# Patient Record
Sex: Male | Born: 1960 | Race: White | Hispanic: No
Health system: Southern US, Community
[De-identification: ages and names within clinical notes are randomized; demographics above are authoritative.]

---

## 2020-09-07 ENCOUNTER — Institutional Professional Consult (permissible substitution)
Admission: EM | Admit: 2020-09-07 | Discharge: 2020-10-24 | Disposition: E | Payer: 59 | Source: Ambulatory Visit | Attending: Internal Medicine | Admitting: Internal Medicine

## 2020-09-07 DIAGNOSIS — J9690 Respiratory failure, unspecified, unspecified whether with hypoxia or hypercapnia: Secondary | ICD-10-CM

## 2020-09-07 DIAGNOSIS — Z931 Gastrostomy status: Secondary | ICD-10-CM

## 2020-09-07 DIAGNOSIS — J189 Pneumonia, unspecified organism: Secondary | ICD-10-CM

## 2020-09-07 DIAGNOSIS — R918 Other nonspecific abnormal finding of lung field: Secondary | ICD-10-CM

## 2020-09-07 DIAGNOSIS — R110 Nausea: Secondary | ICD-10-CM

## 2020-09-07 DIAGNOSIS — R0603 Acute respiratory distress: Secondary | ICD-10-CM

## 2020-09-07 DIAGNOSIS — N179 Acute kidney failure, unspecified: Secondary | ICD-10-CM

## 2020-09-07 DIAGNOSIS — R1110 Vomiting, unspecified: Secondary | ICD-10-CM

## 2020-09-07 DIAGNOSIS — K567 Ileus, unspecified: Secondary | ICD-10-CM

## 2020-09-07 DIAGNOSIS — D72829 Elevated white blood cell count, unspecified: Secondary | ICD-10-CM

## 2020-09-07 DIAGNOSIS — J80 Acute respiratory distress syndrome: Secondary | ICD-10-CM

## 2020-09-07 DIAGNOSIS — D729 Disorder of white blood cells, unspecified: Secondary | ICD-10-CM

## 2020-09-08 ENCOUNTER — Other Ambulatory Visit (HOSPITAL_COMMUNITY): Payer: 59

## 2020-09-08 LAB — CBC
HCT: 27.9 % — ABNORMAL LOW (ref 39.0–52.0)
Hemoglobin: 9.5 g/dL — ABNORMAL LOW (ref 13.0–17.0)
MCH: 30.1 pg (ref 26.0–34.0)
MCHC: 34.1 g/dL (ref 30.0–36.0)
MCV: 88.3 fL (ref 80.0–100.0)
Platelets: 227 10*3/uL (ref 150–400)
RBC: 3.16 MIL/uL — ABNORMAL LOW (ref 4.22–5.81)
RDW: 14.2 % (ref 11.5–15.5)
WBC: 8.6 10*3/uL (ref 4.0–10.5)
nRBC: 0 % (ref 0.0–0.2)

## 2020-09-08 LAB — BASIC METABOLIC PANEL
Anion gap: 7 (ref 5–15)
BUN: 20 mg/dL (ref 6–20)
CO2: 21 mmol/L — ABNORMAL LOW (ref 22–32)
Calcium: 8.4 mg/dL — ABNORMAL LOW (ref 8.9–10.3)
Chloride: 109 mmol/L (ref 98–111)
Creatinine, Ser: 0.91 mg/dL (ref 0.61–1.24)
GFR, Estimated: >60 mL/min (ref >60)
Glucose, Bld: 82 mg/dL (ref 70–99)
Potassium: 4.1 mmol/L (ref 3.5–5.1)
Sodium: 137 mmol/L (ref 135–145)

## 2020-09-08 LAB — EXPECTORATED SPUTUM ASSESSMENT W GRAM STAIN, RFLX TO RESP C

## 2020-09-08 MED ORDER — IOHEXOL 300 MG/ML  SOLN
50.0000 mL | Freq: Once | INTRAMUSCULAR | Status: AC | PRN
  Administered 2020-09-08: 50 mL

## 2020-09-09 LAB — CBC
HCT: 26.6 % — ABNORMAL LOW (ref 39.0–52.0)
Hemoglobin: 9.1 g/dL — ABNORMAL LOW (ref 13.0–17.0)
MCH: 30.2 pg (ref 26.0–34.0)
MCHC: 34.2 g/dL (ref 30.0–36.0)
MCV: 88.4 fL (ref 80.0–100.0)
Platelets: 265 10*3/uL (ref 150–400)
RBC: 3.01 MIL/uL — ABNORMAL LOW (ref 4.22–5.81)
RDW: 13.8 % (ref 11.5–15.5)
WBC: 8.4 10*3/uL (ref 4.0–10.5)
nRBC: 0 % (ref 0.0–0.2)

## 2020-09-09 LAB — COMPREHENSIVE METABOLIC PANEL
ALT: 39 U/L (ref 0–44)
AST: 25 U/L (ref 15–41)
Albumin: 2 g/dL — ABNORMAL LOW (ref 3.5–5.0)
Alkaline Phosphatase: 174 U/L — ABNORMAL HIGH (ref 38–126)
Anion gap: 6 (ref 5–15)
BUN: 17 mg/dL (ref 6–20)
CO2: 24 mmol/L (ref 22–32)
Calcium: 8.4 mg/dL — ABNORMAL LOW (ref 8.9–10.3)
Chloride: 105 mmol/L (ref 98–111)
Creatinine, Ser: 0.95 mg/dL (ref 0.61–1.24)
GFR, Estimated: >60 mL/min (ref >60)
Glucose, Bld: 124 mg/dL — ABNORMAL HIGH (ref 70–99)
Potassium: 4.3 mmol/L (ref 3.5–5.1)
Sodium: 135 mmol/L (ref 135–145)
Total Bilirubin: 0.8 mg/dL (ref 0.3–1.2)
Total Protein: 4.6 g/dL — ABNORMAL LOW (ref 6.5–8.1)

## 2020-09-09 LAB — MAGNESIUM: Magnesium: 1.6 mg/dL — ABNORMAL LOW (ref 1.7–2.4)

## 2020-09-09 LAB — EXPECTORATED SPUTUM ASSESSMENT W GRAM STAIN, RFLX TO RESP C

## 2020-09-09 LAB — PHOSPHORUS: Phosphorus: 3.8 mg/dL (ref 2.5–4.6)

## 2020-09-09 LAB — TSH: TSH: 6.79 u[IU]/mL — ABNORMAL HIGH (ref 0.350–4.500)

## 2020-09-10 ENCOUNTER — Other Ambulatory Visit (HOSPITAL_COMMUNITY): Payer: 59

## 2020-09-10 LAB — BASIC METABOLIC PANEL
Anion gap: 8 (ref 5–15)
BUN: 26 mg/dL — ABNORMAL HIGH (ref 6–20)
CO2: 25 mmol/L (ref 22–32)
Calcium: 8.3 mg/dL — ABNORMAL LOW (ref 8.9–10.3)
Chloride: 105 mmol/L (ref 98–111)
Creatinine, Ser: 1.63 mg/dL — ABNORMAL HIGH (ref 0.61–1.24)
GFR, Estimated: 48 mL/min — ABNORMAL LOW (ref >60)
Glucose, Bld: 146 mg/dL — ABNORMAL HIGH (ref 70–99)
Potassium: 4.9 mmol/L (ref 3.5–5.1)
Sodium: 138 mmol/L (ref 135–145)

## 2020-09-10 LAB — HEMOGLOBIN A1C
Hgb A1c MFr Bld: 6.9 % — ABNORMAL HIGH (ref 4.8–5.6)
Mean Plasma Glucose: 151 mg/dL

## 2020-09-10 LAB — URINALYSIS, ROUTINE W REFLEX MICROSCOPIC
Bilirubin Urine: NEGATIVE
Glucose, UA: 50 mg/dL — AB
Hgb urine dipstick: NEGATIVE
Ketones, ur: 5 mg/dL — AB
Leukocytes,Ua: NEGATIVE
Nitrite: NEGATIVE
Protein, ur: 100 mg/dL — AB
Specific Gravity, Urine: 1.015 (ref 1.005–1.030)
pH: 5 (ref 5.0–8.0)

## 2020-09-10 LAB — MAGNESIUM: Magnesium: 1.8 mg/dL (ref 1.7–2.4)

## 2020-09-10 LAB — T4, FREE: Free T4: 1.09 ng/dL (ref 0.61–1.12)

## 2020-09-10 LAB — PHOSPHORUS: Phosphorus: 3.3 mg/dL (ref 2.5–4.6)

## 2020-09-11 LAB — RENAL FUNCTION PANEL
Albumin: 1.7 g/dL — ABNORMAL LOW (ref 3.5–5.0)
Anion gap: 9 (ref 5–15)
BUN: 38 mg/dL — ABNORMAL HIGH (ref 6–20)
CO2: 21 mmol/L — ABNORMAL LOW (ref 22–32)
Calcium: 8.3 mg/dL — ABNORMAL LOW (ref 8.9–10.3)
Chloride: 108 mmol/L (ref 98–111)
Creatinine, Ser: 1.51 mg/dL — ABNORMAL HIGH (ref 0.61–1.24)
GFR, Estimated: 53 mL/min — ABNORMAL LOW (ref >60)
Glucose, Bld: 156 mg/dL — ABNORMAL HIGH (ref 70–99)
Phosphorus: 3.5 mg/dL (ref 2.5–4.6)
Potassium: 4.6 mmol/L (ref 3.5–5.1)
Sodium: 138 mmol/L (ref 135–145)

## 2020-09-11 LAB — CBC
HCT: 26.1 % — ABNORMAL LOW (ref 39.0–52.0)
Hemoglobin: 8.8 g/dL — ABNORMAL LOW (ref 13.0–17.0)
MCH: 30.3 pg (ref 26.0–34.0)
MCHC: 33.7 g/dL (ref 30.0–36.0)
MCV: 90 fL (ref 80.0–100.0)
Platelets: 213 10*3/uL (ref 150–400)
RBC: 2.9 MIL/uL — ABNORMAL LOW (ref 4.22–5.81)
RDW: 14.8 % (ref 11.5–15.5)
WBC: 16.4 10*3/uL — ABNORMAL HIGH (ref 4.0–10.5)
nRBC: 0 % (ref 0.0–0.2)

## 2020-09-11 LAB — URINE CULTURE: Culture: NO GROWTH

## 2020-09-11 LAB — CULTURE, RESPIRATORY W GRAM STAIN

## 2020-09-12 ENCOUNTER — Other Ambulatory Visit (HOSPITAL_COMMUNITY): Payer: 59

## 2020-09-13 ENCOUNTER — Other Ambulatory Visit (HOSPITAL_COMMUNITY): Payer: 59

## 2020-09-13 LAB — MAGNESIUM: Magnesium: 2.2 mg/dL (ref 1.7–2.4)

## 2020-09-13 LAB — CBC
HCT: 24.9 % — ABNORMAL LOW (ref 39.0–52.0)
Hemoglobin: 7.9 g/dL — ABNORMAL LOW (ref 13.0–17.0)
MCH: 29.8 pg (ref 26.0–34.0)
MCHC: 31.7 g/dL (ref 30.0–36.0)
MCV: 94 fL (ref 80.0–100.0)
Platelets: 300 10*3/uL (ref 150–400)
RBC: 2.65 MIL/uL — ABNORMAL LOW (ref 4.22–5.81)
RDW: 15.3 % (ref 11.5–15.5)
WBC: 11.4 10*3/uL — ABNORMAL HIGH (ref 4.0–10.5)
nRBC: 0 % (ref 0.0–0.2)

## 2020-09-13 LAB — BASIC METABOLIC PANEL
Anion gap: 9 (ref 5–15)
BUN: 60 mg/dL — ABNORMAL HIGH (ref 6–20)
CO2: 26 mmol/L (ref 22–32)
Calcium: 9.1 mg/dL (ref 8.9–10.3)
Chloride: 110 mmol/L (ref 98–111)
Creatinine, Ser: 1.97 mg/dL — ABNORMAL HIGH (ref 0.61–1.24)
GFR, Estimated: 38 mL/min — ABNORMAL LOW (ref >60)
Glucose, Bld: 296 mg/dL — ABNORMAL HIGH (ref 70–99)
Potassium: 4.7 mmol/L (ref 3.5–5.1)
Sodium: 145 mmol/L (ref 135–145)

## 2020-09-14 ENCOUNTER — Other Ambulatory Visit (HOSPITAL_COMMUNITY): Payer: 59

## 2020-09-14 LAB — RENAL FUNCTION PANEL
Albumin: 1.7 g/dL — ABNORMAL LOW (ref 3.5–5.0)
Anion gap: 8 (ref 5–15)
BUN: 50 mg/dL — ABNORMAL HIGH (ref 6–20)
CO2: 25 mmol/L (ref 22–32)
Calcium: 8.7 mg/dL — ABNORMAL LOW (ref 8.9–10.3)
Chloride: 113 mmol/L — ABNORMAL HIGH (ref 98–111)
Creatinine, Ser: 1.7 mg/dL — ABNORMAL HIGH (ref 0.61–1.24)
GFR, Estimated: 46 mL/min — ABNORMAL LOW (ref >60)
Glucose, Bld: 180 mg/dL — ABNORMAL HIGH (ref 70–99)
Phosphorus: 2.9 mg/dL (ref 2.5–4.6)
Potassium: 3.6 mmol/L (ref 3.5–5.1)
Sodium: 146 mmol/L — ABNORMAL HIGH (ref 135–145)

## 2020-09-16 LAB — BASIC METABOLIC PANEL
Anion gap: 4 — ABNORMAL LOW (ref 5–15)
BUN: 43 mg/dL — ABNORMAL HIGH (ref 6–20)
CO2: 27 mmol/L (ref 22–32)
Calcium: 8.9 mg/dL (ref 8.9–10.3)
Chloride: 119 mmol/L — ABNORMAL HIGH (ref 98–111)
Creatinine, Ser: 1.51 mg/dL — ABNORMAL HIGH (ref 0.61–1.24)
GFR, Estimated: 53 mL/min — ABNORMAL LOW (ref >60)
Glucose, Bld: 194 mg/dL — ABNORMAL HIGH (ref 70–99)
Potassium: 3.4 mmol/L — ABNORMAL LOW (ref 3.5–5.1)
Sodium: 150 mmol/L — ABNORMAL HIGH (ref 135–145)

## 2020-09-16 LAB — CBC
HCT: 26.4 % — ABNORMAL LOW (ref 39.0–52.0)
Hemoglobin: 8.3 g/dL — ABNORMAL LOW (ref 13.0–17.0)
MCH: 30.1 pg (ref 26.0–34.0)
MCHC: 31.4 g/dL (ref 30.0–36.0)
MCV: 95.7 fL (ref 80.0–100.0)
Platelets: 307 10*3/uL (ref 150–400)
RBC: 2.76 MIL/uL — ABNORMAL LOW (ref 4.22–5.81)
RDW: 15.2 % (ref 11.5–15.5)
WBC: 11.6 10*3/uL — ABNORMAL HIGH (ref 4.0–10.5)
nRBC: 0 % (ref 0.0–0.2)

## 2020-09-16 LAB — MAGNESIUM: Magnesium: 1.9 mg/dL (ref 1.7–2.4)

## 2020-09-17 LAB — BASIC METABOLIC PANEL
Anion gap: 3 — ABNORMAL LOW (ref 5–15)
BUN: 36 mg/dL — ABNORMAL HIGH (ref 6–20)
CO2: 26 mmol/L (ref 22–32)
Calcium: 8.7 mg/dL — ABNORMAL LOW (ref 8.9–10.3)
Chloride: 119 mmol/L — ABNORMAL HIGH (ref 98–111)
Creatinine, Ser: 1.48 mg/dL — ABNORMAL HIGH (ref 0.61–1.24)
GFR, Estimated: 54 mL/min — ABNORMAL LOW (ref >60)
Glucose, Bld: 189 mg/dL — ABNORMAL HIGH (ref 70–99)
Potassium: 4.2 mmol/L (ref 3.5–5.1)
Sodium: 148 mmol/L — ABNORMAL HIGH (ref 135–145)

## 2020-09-18 LAB — CBC
HCT: 26.7 % — ABNORMAL LOW (ref 39.0–52.0)
Hemoglobin: 8.2 g/dL — ABNORMAL LOW (ref 13.0–17.0)
MCH: 29.4 pg (ref 26.0–34.0)
MCHC: 30.7 g/dL (ref 30.0–36.0)
MCV: 95.7 fL (ref 80.0–100.0)
Platelets: 283 10*3/uL (ref 150–400)
RBC: 2.79 MIL/uL — ABNORMAL LOW (ref 4.22–5.81)
RDW: 15.2 % (ref 11.5–15.5)
WBC: 9.8 10*3/uL (ref 4.0–10.5)
nRBC: 0 % (ref 0.0–0.2)

## 2020-09-18 LAB — RENAL FUNCTION PANEL
Albumin: 1.7 g/dL — ABNORMAL LOW (ref 3.5–5.0)
Anion gap: 6 (ref 5–15)
BUN: 39 mg/dL — ABNORMAL HIGH (ref 6–20)
CO2: 28 mmol/L (ref 22–32)
Calcium: 8.6 mg/dL — ABNORMAL LOW (ref 8.9–10.3)
Chloride: 106 mmol/L (ref 98–111)
Creatinine, Ser: 1.45 mg/dL — ABNORMAL HIGH (ref 0.61–1.24)
GFR, Estimated: 55 mL/min — ABNORMAL LOW (ref >60)
Glucose, Bld: 213 mg/dL — ABNORMAL HIGH (ref 70–99)
Phosphorus: 2.4 mg/dL — ABNORMAL LOW (ref 2.5–4.6)
Potassium: 4.4 mmol/L (ref 3.5–5.1)
Sodium: 140 mmol/L (ref 135–145)

## 2020-09-18 LAB — MAGNESIUM
Magnesium: 1.6 mg/dL — ABNORMAL LOW (ref 1.7–2.4)
Magnesium: 2.3 mg/dL (ref 1.7–2.4)

## 2020-09-19 LAB — MAGNESIUM: Magnesium: 2.3 mg/dL (ref 1.7–2.4)

## 2020-09-19 LAB — BASIC METABOLIC PANEL
Anion gap: 7 (ref 5–15)
BUN: 39 mg/dL — ABNORMAL HIGH (ref 6–20)
CO2: 25 mmol/L (ref 22–32)
Calcium: 8.3 mg/dL — ABNORMAL LOW (ref 8.9–10.3)
Chloride: 106 mmol/L (ref 98–111)
Creatinine, Ser: 1.41 mg/dL — ABNORMAL HIGH (ref 0.61–1.24)
GFR, Estimated: 57 mL/min — ABNORMAL LOW (ref >60)
Glucose, Bld: 255 mg/dL — ABNORMAL HIGH (ref 70–99)
Potassium: 4.8 mmol/L (ref 3.5–5.1)
Sodium: 138 mmol/L (ref 135–145)

## 2020-09-20 ENCOUNTER — Other Ambulatory Visit (HOSPITAL_COMMUNITY): Payer: 59

## 2020-09-20 LAB — RENAL FUNCTION PANEL
Albumin: 1.8 g/dL — ABNORMAL LOW (ref 3.5–5.0)
Anion gap: 5 (ref 5–15)
BUN: 37 mg/dL — ABNORMAL HIGH (ref 6–20)
CO2: 27 mmol/L (ref 22–32)
Calcium: 8.5 mg/dL — ABNORMAL LOW (ref 8.9–10.3)
Chloride: 109 mmol/L (ref 98–111)
Creatinine, Ser: 1.34 mg/dL — ABNORMAL HIGH (ref 0.61–1.24)
GFR, Estimated: >60 mL/min (ref >60)
Glucose, Bld: 106 mg/dL — ABNORMAL HIGH (ref 70–99)
Phosphorus: 3.2 mg/dL (ref 2.5–4.6)
Potassium: 3.9 mmol/L (ref 3.5–5.1)
Sodium: 141 mmol/L (ref 135–145)

## 2020-09-20 LAB — CBC
HCT: 27.6 % — ABNORMAL LOW (ref 39.0–52.0)
Hemoglobin: 8.6 g/dL — ABNORMAL LOW (ref 13.0–17.0)
MCH: 29.4 pg (ref 26.0–34.0)
MCHC: 31.2 g/dL (ref 30.0–36.0)
MCV: 94.2 fL (ref 80.0–100.0)
Platelets: 287 10*3/uL (ref 150–400)
RBC: 2.93 MIL/uL — ABNORMAL LOW (ref 4.22–5.81)
RDW: 15.2 % (ref 11.5–15.5)
WBC: 8.4 10*3/uL (ref 4.0–10.5)
nRBC: 0 % (ref 0.0–0.2)

## 2020-09-20 LAB — MAGNESIUM: Magnesium: 2 mg/dL (ref 1.7–2.4)

## 2020-09-22 LAB — CBC
HCT: 26 % — ABNORMAL LOW (ref 39.0–52.0)
Hemoglobin: 8.3 g/dL — ABNORMAL LOW (ref 13.0–17.0)
MCH: 30 pg (ref 26.0–34.0)
MCHC: 31.9 g/dL (ref 30.0–36.0)
MCV: 93.9 fL (ref 80.0–100.0)
Platelets: 412 10*3/uL — ABNORMAL HIGH (ref 150–400)
RBC: 2.77 MIL/uL — ABNORMAL LOW (ref 4.22–5.81)
RDW: 15.4 % (ref 11.5–15.5)
WBC: 9.7 10*3/uL (ref 4.0–10.5)
nRBC: 0 % (ref 0.0–0.2)

## 2020-09-22 LAB — RENAL FUNCTION PANEL
Albumin: 2 g/dL — ABNORMAL LOW (ref 3.5–5.0)
Anion gap: 6 (ref 5–15)
BUN: 65 mg/dL — ABNORMAL HIGH (ref 6–20)
CO2: 28 mmol/L (ref 22–32)
Calcium: 8.6 mg/dL — ABNORMAL LOW (ref 8.9–10.3)
Chloride: 108 mmol/L (ref 98–111)
Creatinine, Ser: 1.58 mg/dL — ABNORMAL HIGH (ref 0.61–1.24)
GFR, Estimated: 50 mL/min — ABNORMAL LOW (ref >60)
Glucose, Bld: 104 mg/dL — ABNORMAL HIGH (ref 70–99)
Phosphorus: 3.3 mg/dL (ref 2.5–4.6)
Potassium: 4.5 mmol/L (ref 3.5–5.1)
Sodium: 142 mmol/L (ref 135–145)

## 2020-09-22 LAB — MAGNESIUM: Magnesium: 2 mg/dL (ref 1.7–2.4)

## 2020-09-23 ENCOUNTER — Other Ambulatory Visit (HOSPITAL_COMMUNITY): Payer: 59

## 2020-09-23 LAB — BLOOD GAS, ARTERIAL
Acid-Base Excess: 1.9 mmol/L (ref 0.0–2.0)
Acid-Base Excess: 1.1 mmol/L (ref 0.0–2.0)
Bicarbonate: 26.3 mmol/L (ref 20.0–28.0)
Bicarbonate: 25.3 mmol/L (ref 20.0–28.0)
Drawn by: 164
FIO2: 80
FIO2: 100
O2 Saturation: 89.5 %
O2 Saturation: 88.8 %
Patient temperature: 36.8
Patient temperature: 37
pCO2 arterial: 43.3 mmHg (ref 32.0–48.0)
pCO2 arterial: 41 mmHg (ref 32.0–48.0)
pH, Arterial: 7.4 (ref 7.350–7.450)
pH, Arterial: 7.408 (ref 7.350–7.450)
pO2, Arterial: 57 mmHg — ABNORMAL LOW (ref 83.0–108.0)
pO2, Arterial: 54.4 mmHg — ABNORMAL LOW (ref 83.0–108.0)

## 2020-09-23 LAB — CBC
HCT: 29 % — ABNORMAL LOW (ref 39.0–52.0)
HCT: 25.8 % — ABNORMAL LOW (ref 39.0–52.0)
Hemoglobin: 9.2 g/dL — ABNORMAL LOW (ref 13.0–17.0)
Hemoglobin: 8 g/dL — ABNORMAL LOW (ref 13.0–17.0)
MCH: 29.9 pg (ref 26.0–34.0)
MCH: 29.7 pg (ref 26.0–34.0)
MCHC: 31.7 g/dL (ref 30.0–36.0)
MCHC: 31 g/dL (ref 30.0–36.0)
MCV: 94.2 fL (ref 80.0–100.0)
MCV: 95.9 fL (ref 80.0–100.0)
Platelets: 489 K/uL — ABNORMAL HIGH (ref 150–400)
Platelets: 444 10*3/uL — ABNORMAL HIGH (ref 150–400)
RBC: 3.08 MIL/uL — ABNORMAL LOW (ref 4.22–5.81)
RBC: 2.69 MIL/uL — ABNORMAL LOW (ref 4.22–5.81)
RDW: 15.5 % (ref 11.5–15.5)
RDW: 15.7 % — ABNORMAL HIGH (ref 11.5–15.5)
WBC: 19.7 K/uL — ABNORMAL HIGH (ref 4.0–10.5)
WBC: 12.8 10*3/uL — ABNORMAL HIGH (ref 4.0–10.5)
nRBC: 0 % (ref 0.0–0.2)
nRBC: 0 % (ref 0.0–0.2)

## 2020-09-23 LAB — RENAL FUNCTION PANEL
Albumin: 1.7 g/dL — ABNORMAL LOW (ref 3.5–5.0)
Anion gap: 5 (ref 5–15)
BUN: 65 mg/dL — ABNORMAL HIGH (ref 6–20)
CO2: 24 mmol/L (ref 22–32)
Calcium: 7.7 mg/dL — ABNORMAL LOW (ref 8.9–10.3)
Chloride: 113 mmol/L — ABNORMAL HIGH (ref 98–111)
Creatinine, Ser: 2.28 mg/dL — ABNORMAL HIGH (ref 0.61–1.24)
GFR, Estimated: 32 mL/min — ABNORMAL LOW
Glucose, Bld: 96 mg/dL (ref 70–99)
Phosphorus: 2.5 mg/dL (ref 2.5–4.6)
Potassium: 4.4 mmol/L (ref 3.5–5.1)
Sodium: 142 mmol/L (ref 135–145)

## 2020-09-23 LAB — LACTIC ACID, PLASMA
Lactic Acid, Venous: 1.5 mmol/L (ref 0.5–1.9)
Lactic Acid, Venous: 1.9 mmol/L (ref 0.5–1.9)

## 2020-09-23 LAB — BRAIN NATRIURETIC PEPTIDE: B Natriuretic Peptide: 25.7 pg/mL (ref 0.0–100.0)

## 2020-09-23 LAB — MAGNESIUM: Magnesium: 1.5 mg/dL — ABNORMAL LOW (ref 1.7–2.4)

## 2020-09-23 DEATH — deceased

## 2020-09-24 ENCOUNTER — Other Ambulatory Visit (HOSPITAL_COMMUNITY): Payer: 59

## 2020-09-24 LAB — RENAL FUNCTION PANEL
Albumin: 1.9 g/dL — ABNORMAL LOW (ref 3.5–5.0)
Anion gap: 13 (ref 5–15)
BUN: 77 mg/dL — ABNORMAL HIGH (ref 6–20)
CO2: 23 mmol/L (ref 22–32)
Calcium: 8.2 mg/dL — ABNORMAL LOW (ref 8.9–10.3)
Chloride: 102 mmol/L (ref 98–111)
Creatinine, Ser: 3.24 mg/dL — ABNORMAL HIGH (ref 0.61–1.24)
GFR, Estimated: 21 mL/min — ABNORMAL LOW (ref >60)
Glucose, Bld: 90 mg/dL (ref 70–99)
Phosphorus: 3.6 mg/dL (ref 2.5–4.6)
Potassium: 5.7 mmol/L — ABNORMAL HIGH (ref 3.5–5.1)
Sodium: 138 mmol/L (ref 135–145)

## 2020-09-24 LAB — CBC
HCT: 29 % — ABNORMAL LOW (ref 39.0–52.0)
Hemoglobin: 9.4 g/dL — ABNORMAL LOW (ref 13.0–17.0)
MCH: 30.5 pg (ref 26.0–34.0)
MCHC: 32.4 g/dL (ref 30.0–36.0)
MCV: 94.2 fL (ref 80.0–100.0)
Platelets: 489 10*3/uL — ABNORMAL HIGH (ref 150–400)
RBC: 3.08 MIL/uL — ABNORMAL LOW (ref 4.22–5.81)
RDW: 16 % — ABNORMAL HIGH (ref 11.5–15.5)
WBC: 27.8 10*3/uL — ABNORMAL HIGH (ref 4.0–10.5)
nRBC: 0 % (ref 0.0–0.2)

## 2020-09-24 LAB — MAGNESIUM: Magnesium: 2.3 mg/dL (ref 1.7–2.4)

## 2020-09-25 ENCOUNTER — Other Ambulatory Visit (HOSPITAL_COMMUNITY): Payer: 59

## 2020-09-25 DIAGNOSIS — R490 Dysphonia: Secondary | ICD-10-CM

## 2020-09-25 DIAGNOSIS — J3802 Paralysis of vocal cords and larynx, bilateral: Secondary | ICD-10-CM | POA: Diagnosis not present

## 2020-09-25 LAB — HEMOGLOBIN AND HEMATOCRIT, BLOOD
HCT: 25.3 % — ABNORMAL LOW (ref 39.0–52.0)
Hemoglobin: 8.3 g/dL — ABNORMAL LOW (ref 13.0–17.0)

## 2020-09-25 LAB — CBC
HCT: 21.4 % — ABNORMAL LOW (ref 39.0–52.0)
Hemoglobin: 6.8 g/dL — CL (ref 13.0–17.0)
MCH: 30 pg (ref 26.0–34.0)
MCHC: 31.8 g/dL (ref 30.0–36.0)
MCV: 94.3 fL (ref 80.0–100.0)
Platelets: 384 10*3/uL (ref 150–400)
RBC: 2.27 MIL/uL — ABNORMAL LOW (ref 4.22–5.81)
RDW: 15.8 % — ABNORMAL HIGH (ref 11.5–15.5)
WBC: 23.6 10*3/uL — ABNORMAL HIGH (ref 4.0–10.5)
nRBC: 0 % (ref 0.0–0.2)

## 2020-09-25 LAB — BASIC METABOLIC PANEL
Anion gap: 10 (ref 5–15)
BUN: 87 mg/dL — ABNORMAL HIGH (ref 6–20)
CO2: 25 mmol/L (ref 22–32)
Calcium: 7.6 mg/dL — ABNORMAL LOW (ref 8.9–10.3)
Chloride: 101 mmol/L (ref 98–111)
Creatinine, Ser: 3.93 mg/dL — ABNORMAL HIGH (ref 0.61–1.24)
GFR, Estimated: 17 mL/min — ABNORMAL LOW (ref >60)
Glucose, Bld: 98 mg/dL (ref 70–99)
Potassium: 4.3 mmol/L (ref 3.5–5.1)
Sodium: 136 mmol/L (ref 135–145)

## 2020-09-25 LAB — ABO/RH: ABO/RH(D): O POS

## 2020-09-25 LAB — OCCULT BLOOD X 1 CARD TO LAB, STOOL: Fecal Occult Bld: NEGATIVE

## 2020-09-25 LAB — PREPARE RBC (CROSSMATCH)

## 2020-09-26 LAB — CBC
HCT: 25.6 % — ABNORMAL LOW (ref 39.0–52.0)
Hemoglobin: 8.5 g/dL — ABNORMAL LOW (ref 13.0–17.0)
MCH: 30.6 pg (ref 26.0–34.0)
MCHC: 33.2 g/dL (ref 30.0–36.0)
MCV: 92.1 fL (ref 80.0–100.0)
Platelets: 374 10*3/uL (ref 150–400)
RBC: 2.78 MIL/uL — ABNORMAL LOW (ref 4.22–5.81)
RDW: 15.3 % (ref 11.5–15.5)
WBC: 21.2 10*3/uL — ABNORMAL HIGH (ref 4.0–10.5)
nRBC: 0 % (ref 0.0–0.2)

## 2020-09-26 LAB — BASIC METABOLIC PANEL
Anion gap: 11 (ref 5–15)
BUN: 91 mg/dL — ABNORMAL HIGH (ref 6–20)
CO2: 20 mmol/L — ABNORMAL LOW (ref 22–32)
Calcium: 7.7 mg/dL — ABNORMAL LOW (ref 8.9–10.3)
Chloride: 104 mmol/L (ref 98–111)
Creatinine, Ser: 4.57 mg/dL — ABNORMAL HIGH (ref 0.61–1.24)
GFR, Estimated: 14 mL/min — ABNORMAL LOW (ref >60)
Glucose, Bld: 131 mg/dL — ABNORMAL HIGH (ref 70–99)
Potassium: 4.1 mmol/L (ref 3.5–5.1)
Sodium: 135 mmol/L (ref 135–145)

## 2020-09-26 LAB — MAGNESIUM: Magnesium: 2 mg/dL (ref 1.7–2.4)

## 2020-09-26 NOTE — Consult Note (Signed)
[facility] CONSULT NOTE    Date: 09/26/2020                  Patient Name:  Maurice Ortega  MRN: [id number]  DOB: 1960-11-12  Age / Sex: 60 y.o., male         PCP: No primary care provider on file.                 Service Requesting Consult:  Hospitalist                 Reason for Consult:  Acute kidney injury            History of Present Illness: Patient is a 60 y.o. male with a PMHx of acute respiratory failure with hypoxia, aspiration pneumonia, ARDS, history of posterior circulation CVA, history of epidermoid cyst status postcraniotomy, chronic kidney disease stage II, diabetes mellitus type 2, dysphagia, who was admitted to [facility] on September 22, 2020 for ongoing management.  Patient unable to provide any history at this point in time.  We are now asked to see him for evaluation management of acute kidney injury.  BUN currently up to 91 with a creatinine 4.57.  He has known chronic kidney disease stage IIIa at baseline.  When he first arrived here his creatinine was 0.91.  He was also significantly anemic with hemoglobin of 6.8 and is status post blood transfusion.  In addition patient had hypotension over the weekend and was started on Levophed drip.  In addition patient was on Lasix 40 mg daily.  This was subsequently discontinued on 09/23/2020. Patient was also previously on vancomycin.   Medications:  Current medications: Cefepime 2 g IV twice daily, D5 half-normal saline drip, metronidazole 500 mg 3 times daily, Flexeril Humalog, amantadine 50 mg daily, amlodipine 5 mg daily, atorvastatin 40 mg nightly, clonazepam 0.5 mg twice daily, erythromycin 250 mg every 6 hours, fish oil 4 g daily, guaifenesin 200 mg 3 times daily, ipratropium albuterol 3 mL inhaled every 6 hours, Lantus 10 units daily, melatonin 3 mg nightly, metoclopramide 5 mg every 8 hours, modafinil 100 mg every morning, pantoprazole 40 mg twice daily, prednisone 10 mg daily, sertraline 50 mg  daily, multivitamin 1 tablet daily, Zyvox 600 mg IV twice daily   Allergies: No known drug allergies   Past Medical History: acute respiratory failure with hypoxia, aspiration pneumonia, ARDS, history of posterior circulation CVA, history of epidermoid cyst status postcraniotomy, chronic kidney disease stage IIIa, diabetes mellitus type 2, dysphagia  Past Surgical History: Status post craniotomy with epidermoid cyst removal PEG tube placement  Family History: Unable to obtain from the patient as he is currently nonverbal.  Social History: Social History   Socioeconomic History  . Marital status: Widowed    Spouse name: Not on file  . Number of children: Not on file  . Years of education: Not on file  . Highest education level: Not on file  Occupational History  . Not on file  Tobacco Use  . Smoking status: Not on file  . Smokeless tobacco: Not on file  Substance and Sexual Activity  . Alcohol use: Not on file  . Drug use: Not on file  . Sexual activity: Not on file  Other Topics Concern  . Not on file  Social History Narrative  . Not on file   Social Determinants of Health   Financial Resource Strain: Not on file  Food Insecurity: Not on file  Transportation Needs: Not  on file  Physical Activity: Not on file  Stress: Not on file  Social Connections: Not on file  Intimate Partner Violence: Not on file     Review of Systems: Unable to obtain from the patient as he is currently nonverbal.  Vital Signs: There were no vitals taken for this visit.  Weight trends: Temperature 96.9 pulse 103 respirations 18 blood pressure 119/63  Physical Exam: Physical Exam: General:  No acute distress  Head:  Normocephalic, atraumatic. Moist oral mucosal membranes  Eyes:  Anicteric  Neck:  Trachea midline  Lungs:   Clear to auscultation, normal effort  Heart:  S1S2 no rubs  Abdomen:   Soft, nontender, bowel sounds present  Extremities:  No peripheral edema.   Neurologic:  Awake, alert, following commands  Skin:  No lesions  Access:  No dialysis access    Lab results: Basic Metabolic Panel: Recent Labs  Lab 09/20/20 0501 09/22/20 0418 09/23/20 0958 09/24/20 0728 09/25/20 0439 09/26/20 0437  NA 141 142 142 138 136 135  K 3.9 4.5 4.4 5.7* 4.3 4.1  CL 109 108 113* 102 101 104  CO2 27 28 24 23 25  20*  GLUCOSE 106* 104* 96 90 98 131*  BUN 37* 65* 65* 77* 87* 91*  CREATININE 1.34* 1.58* 2.28* 3.24* 3.93* 4.57*  CALCIUM 8.5* 8.6* 7.7* 8.2* 7.6* 7.7*  MG 2.0 2.0 1.5* 2.3  --  2.0  PHOS 3.2 3.3 2.5 3.6  --   --     Liver Function Tests: Recent Labs  Lab 09/22/20 0418 09/23/20 0958 09/24/20 0728  ALBUMIN 2.0* 1.7* 1.9*   No results for input(s): LIPASE, AMYLASE in the last 168 hours. No results for input(s): AMMONIA in the last 168 hours.  CBC: Recent Labs  Lab 09/23/20 0420 09/23/20 0958 09/24/20 0728 09/25/20 0439 09/25/20 2110 09/26/20 0437  WBC 19.7* 12.8* 27.8* 23.6*  --  21.2*  HGB 9.2* 8.0* 9.4* 6.8* 8.3* 8.5*  HCT 29.0* 25.8* 29.0* 21.4* 25.3* 25.6*  MCV 94.2 95.9 94.2 94.3  --  92.1  PLT 489* 444* 489* 384  --  374    Cardiac Enzymes: No results for input(s): CKTOTAL, CKMB, CKMBINDEX, TROPONINI in the last 168 hours.  BNP: Invalid input(s): POCBNP  CBG: No results for input(s): GLUCAP in the last 168 hours.  Microbiology: Results for orders placed or performed during the hospital encounter of 09/01/2020  Expectorated Sputum Assessment w Gram Stain, Rflx to Resp Cult     Status: None   Collection Time: 09/08/20  2:45 PM   Specimen: Expectorated Sputum  Result Value Ref Range Status   Specimen Description EXPECTORATED SPUTUM  Final   Special Requests NONE  Final   Sputum evaluation   Final    Sputum specimen not acceptable for testing.  Please recollect.   09/10/20 RN  @ 2014 09/08/20 EB Performed at [facility], [address], [place], [place]    Report Status 09/08/2020  FINAL  Final  Expectorated Sputum Assessment w Gram Stain, Rflx to Resp Cult     Status: None   Collection Time: 09/08/20 10:10 PM   Specimen: Sputum  Result Value Ref Range Status   Specimen Description SPUTUM  Final   Special Requests NONE  Final   Sputum evaluation   Final    THIS SPECIMEN IS ACCEPTABLE FOR SPUTUM CULTURE Performed at [facility], [address] [place], [place]    Report Status 09/09/2020 FINAL  Final  Culture, Respiratory w Gram Stain     Status: None   Collection Time: 09/08/20 10:10 PM   Specimen: SPU  Result Value Ref Range Status   Specimen Description SPUTUM  Final   Special Requests NONE Reflexed from [id number]  Final   Gram Stain   Final    ABUNDANT WBC PRESENT,BOTH PMN AND MONONUCLEAR FEW GRAM NEGATIVE RODS Performed at [facility], [address] [place], [place] [address]    Culture ABUNDANT KLEBSIELLA PNEUMONIAE  Final   Report Status 09/11/2020 FINAL  Final   Organism ID, Bacteria KLEBSIELLA PNEUMONIAE  Final      Susceptibility   Klebsiella pneumoniae - MIC*    AMPICILLIN >=32 RESISTANT Resistant     CEFAZOLIN <=4 SENSITIVE Sensitive     CEFEPIME <=0.12 SENSITIVE Sensitive     CEFTAZIDIME <=1 SENSITIVE Sensitive     CEFTRIAXONE <=0.25 SENSITIVE Sensitive     CIPROFLOXACIN <=0.25 SENSITIVE Sensitive     GENTAMICIN <=1 SENSITIVE Sensitive     IMIPENEM <=0.25 SENSITIVE Sensitive     TRIMETH/SULFA <=20 SENSITIVE Sensitive     AMPICILLIN/SULBACTAM 16 INTERMEDIATE Intermediate     PIP/TAZO 16 SENSITIVE Sensitive     * ABUNDANT KLEBSIELLA PNEUMONIAE  Urine Culture     Status: None   Collection Time: 09/10/20  3:30 PM   Specimen: Urine, Random  Result Value Ref Range Status   Specimen Description URINE, RANDOM  Final   Special Requests NONE  Final   Culture   Final    NO GROWTH Performed at [facility], [address] [place], [place] [address]    Report Status 09/11/2020 FINAL  Final    Coagulation  Studies: No results for input(s): LABPROT, INR in the last 72 hours.  Urinalysis: No results for input(s): COLORURINE, LABSPEC, PHURINE, GLUCOSEU, HGBUR, BILIRUBINUR, KETONESUR, PROTEINUR, UROBILINOGEN, NITRITE, LEUKOCYTESUR in the last 72 hours.  Invalid input(s): APPERANCEUR    Imaging: DG Abd 1 View  Result Date: 09/25/2020 CLINICAL DATA:  Ileus. EXAM: ABDOMEN - 1 VIEW COMPARISON:  Abdomen 09/24/2020.  Chest x-ray 10/01/2020. FINDINGS: Gastrostomy tube noted in good anatomic position. Interim near complete resolution of small-bowel distention. Colonic gas pattern is normal. No free air. Bibasilar atelectasis/infiltrates again noted. IMPRESSION: 1. Gastrostomy tube noted good anatomic position. Interim near complete resolution of small-bowel distention. 2.  Bibasilar atelectasis/infiltrates again noted Electronically Signed   By: Maisie Fushomas  Register   On: 09/25/2020 06:17   US RENAL  Result Date: 09/25/2020 CLINICAL DATA:  Acute kidney injury EXAM: RENAL / URINARY TRACT ULTRASOUND COMPLETE COMPARISON:  None. FINDINGS: Right Kidney: Renal measurements: 11.9 x 5.1 x 6.3 cm = volume: 198 mL. Echogenicity within normal limits. No mass or hydronephrosis visualized. Left Kidney: Renal measurements: 11.3 x 6.6 x 4.8 cm = volume: 187 mL. Echogenicity within normal limits. No mass or hydronephrosis visualized. Bladder: Decompressed by Foley catheter Other: Right pleural effusion IMPRESSION: Normal renal ultrasound. Right pleural effusion. Electronically Signed   By: Deatra RobinsonKevin  Herman M.D.   On: 09/25/2020 20:41   DG Chest Port 1 View  Result Date: 09/25/2020 CLINICAL DATA:  Shortness of breath and elevated white blood cell count. Chest pain and cough. EXAM: PORTABLE CHEST 1 VIEW COMPARISON:  Single-view of the chest 09/24/2020 and 09/23/2020. FINDINGS: Airspace disease in the mid and lower lung zones bilaterally is worse on the left and has slightly progressed since the most recent exam. No pneumothorax or  pleural fluid. Heart size is normal. Aortic  atherosclerosis. IMPRESSION: Mild worsening of left greater than right airspace disease consistent with pneumonia. Electronically Signed   By: Drusilla Kanner M.D.   On: 09/25/2020 14:13   DG CHEST PORT 1 VIEW  Result Date: 09/24/2020 CLINICAL DATA:  Pneumonia EXAM: PORTABLE CHEST 1 VIEW COMPARISON:  09/24/2019 FINDINGS: Cardiac shadow is stable. Lungs are well aerated bilaterally with patchy bi basilar airspace opacity although somewhat improved when compared with the prior exam. No sizable effusion is noted. No bony abnormality is seen. IMPRESSION: Persistent but improving bibasilar airspace opacities. Electronically Signed   By: Alcide Clever M.D.   On: 09/24/2020 12:34   DG Abd Portable 1V  Result Date: 09/25/2020 CLINICAL DATA:  History of ileus. EXAM: PORTABLE ABDOMEN - 1 VIEW COMPARISON:  Single-view of the abdomen 09/25/2020 and 09/24/2020. FINDINGS: Peg tube remains in place. Gaseous distention of bowel has improved. The bowel gas pattern is unremarkable. IMPRESSION: Ileus appears resolved.  No acute finding. Electronically Signed   By: Drusilla Kanner M.D.   On: 09/25/2020 14:14      Assessment & Plan: Pt is a 60 y.o. male with a PMHx of acute respiratory failure with hypoxia, aspiration pneumonia, ARDS, history of posterior circulation CVA, history of epidermoid cyst status postcraniotomy, chronic kidney disease stage II, diabetes mellitus type 2, dysphagia, who was admitted to [facility] on 09-12-20 for ongoing management.  1.  Acute kidney injury/chronic kidney disease stage II baseline creatinine 0.9.  Suspect acute kidney injury due to medications with contribution from vancomycin and Lasix as well as dehydration.  Agree with IV fluid hydration. Renal ultrasound negative for hydronephrosis.  No immediate need for dialysis.  However this may need to be considered if renal function continues to deteriorate.  2.  Anemia of chronic  kidney disease.  Hemoglobin 8.5.  No immediate need for Epogen but may need to consider this if hemoglobin continues to drop.

## 2020-09-28 LAB — CBC
HCT: 28.7 % — ABNORMAL LOW (ref 39.0–52.0)
HCT: 24.8 % — ABNORMAL LOW (ref 39.0–52.0)
Hemoglobin: 9.2 g/dL — ABNORMAL LOW (ref 13.0–17.0)
Hemoglobin: 8.2 g/dL — ABNORMAL LOW (ref 13.0–17.0)
MCH: 30.4 pg (ref 26.0–34.0)
MCH: 30.3 pg (ref 26.0–34.0)
MCHC: 32.1 g/dL (ref 30.0–36.0)
MCHC: 33.1 g/dL (ref 30.0–36.0)
MCV: 94.7 fL (ref 80.0–100.0)
MCV: 91.5 fL (ref 80.0–100.0)
Platelets: 381 10*3/uL (ref 150–400)
Platelets: 298 10*3/uL (ref 150–400)
RBC: 3.03 MIL/uL — ABNORMAL LOW (ref 4.22–5.81)
RBC: 2.71 MIL/uL — ABNORMAL LOW (ref 4.22–5.81)
RDW: 15.6 % — ABNORMAL HIGH (ref 11.5–15.5)
RDW: 15.8 % — ABNORMAL HIGH (ref 11.5–15.5)
WBC: 21.2 10*3/uL — ABNORMAL HIGH (ref 4.0–10.5)
WBC: 18.3 10*3/uL — ABNORMAL HIGH (ref 4.0–10.5)
nRBC: 0 % (ref 0.0–0.2)
nRBC: 0 % (ref 0.0–0.2)

## 2020-09-28 LAB — URINALYSIS, ROUTINE W REFLEX MICROSCOPIC
Bilirubin Urine: NEGATIVE
Glucose, UA: 50 mg/dL — AB
Ketones, ur: 5 mg/dL — AB
Nitrite: NEGATIVE
Protein, ur: 100 mg/dL — AB
Specific Gravity, Urine: 1.013 (ref 1.005–1.030)
pH: 5 (ref 5.0–8.0)

## 2020-09-28 LAB — COMPREHENSIVE METABOLIC PANEL
ALT: 12 U/L (ref 0–44)
AST: 13 U/L — ABNORMAL LOW (ref 15–41)
Albumin: 1.3 g/dL — ABNORMAL LOW (ref 3.5–5.0)
Alkaline Phosphatase: 119 U/L (ref 38–126)
Anion gap: 11 (ref 5–15)
BUN: 93 mg/dL — ABNORMAL HIGH (ref 6–20)
CO2: 12 mmol/L — ABNORMAL LOW (ref 22–32)
Calcium: 7.4 mg/dL — ABNORMAL LOW (ref 8.9–10.3)
Chloride: 109 mmol/L (ref 98–111)
Creatinine, Ser: 5.93 mg/dL — ABNORMAL HIGH (ref 0.61–1.24)
GFR, Estimated: 10 mL/min — ABNORMAL LOW (ref >60)
Glucose, Bld: 55 mg/dL — ABNORMAL LOW (ref 70–99)
Potassium: 4.4 mmol/L (ref 3.5–5.1)
Sodium: 132 mmol/L — ABNORMAL LOW (ref 135–145)
Total Bilirubin: 0.8 mg/dL (ref 0.3–1.2)
Total Protein: 4.9 g/dL — ABNORMAL LOW (ref 6.5–8.1)

## 2020-09-28 LAB — BLOOD GAS, ARTERIAL
Acid-base deficit: 12.4 mmol/L — ABNORMAL HIGH (ref 0.0–2.0)
Bicarbonate: 12.5 mmol/L — ABNORMAL LOW (ref 20.0–28.0)
FIO2: 34
O2 Saturation: 94 %
Patient temperature: 36.8
pCO2 arterial: 24.7 mmHg — ABNORMAL LOW (ref 32.0–48.0)
pH, Arterial: 7.323 — ABNORMAL LOW (ref 7.350–7.450)
pO2, Arterial: 71.9 mmHg — ABNORMAL LOW (ref 83.0–108.0)

## 2020-09-28 LAB — APTT: aPTT: 39 seconds — ABNORMAL HIGH (ref 24–36)

## 2020-09-28 LAB — RENAL FUNCTION PANEL
Albumin: 1.5 g/dL — ABNORMAL LOW (ref 3.5–5.0)
Anion gap: 13 (ref 5–15)
BUN: 89 mg/dL — ABNORMAL HIGH (ref 6–20)
CO2: 13 mmol/L — ABNORMAL LOW (ref 22–32)
Calcium: 7.8 mg/dL — ABNORMAL LOW (ref 8.9–10.3)
Chloride: 106 mmol/L (ref 98–111)
Creatinine, Ser: 5.51 mg/dL — ABNORMAL HIGH (ref 0.61–1.24)
GFR, Estimated: 11 mL/min — ABNORMAL LOW (ref >60)
Glucose, Bld: 313 mg/dL — ABNORMAL HIGH (ref 70–99)
Phosphorus: 4.3 mg/dL (ref 2.5–4.6)
Potassium: 5.1 mmol/L (ref 3.5–5.1)
Sodium: 132 mmol/L — ABNORMAL LOW (ref 135–145)

## 2020-09-28 LAB — PROTIME-INR
INR: 1.5 — ABNORMAL HIGH (ref 0.8–1.2)
Prothrombin Time: 18.3 seconds — ABNORMAL HIGH (ref 11.4–15.2)

## 2020-09-28 LAB — LACTIC ACID, PLASMA: Lactic Acid, Venous: 0.9 mmol/L (ref 0.5–1.9)

## 2020-09-28 LAB — AMMONIA: Ammonia: 29 umol/L (ref 9–35)

## 2020-09-28 LAB — GLUCOSE, RANDOM: Glucose, Bld: 63 mg/dL — ABNORMAL LOW (ref 70–99)

## 2020-09-29 ENCOUNTER — Other Ambulatory Visit (HOSPITAL_COMMUNITY): Payer: 59

## 2020-09-29 LAB — TYPE AND SCREEN
ABO/RH(D): O POS
Antibody Screen: POSITIVE
DAT, IgG: POSITIVE
Donor AG Type: NEGATIVE
Donor AG Type: NEGATIVE
Unit division: 0
Unit division: 0

## 2020-09-29 LAB — URINE CULTURE: Culture: 60000 — AB

## 2020-09-29 LAB — BPAM RBC
Blood Product Expiration Date: 202206022359
Blood Product Expiration Date: 202206022359
ISSUE DATE / TIME: 202205031443
Unit Type and Rh: 5100
Unit Type and Rh: 5100

## 2020-09-30 LAB — CBC
HCT: 24.2 % — ABNORMAL LOW (ref 39.0–52.0)
Hemoglobin: 8.2 g/dL — ABNORMAL LOW (ref 13.0–17.0)
MCH: 30.4 pg (ref 26.0–34.0)
MCHC: 33.9 g/dL (ref 30.0–36.0)
MCV: 89.6 fL (ref 80.0–100.0)
Platelets: 255 10*3/uL (ref 150–400)
RBC: 2.7 MIL/uL — ABNORMAL LOW (ref 4.22–5.81)
RDW: 15.8 % — ABNORMAL HIGH (ref 11.5–15.5)
WBC: 16.2 10*3/uL — ABNORMAL HIGH (ref 4.0–10.5)
nRBC: 0 % (ref 0.0–0.2)

## 2020-09-30 LAB — RENAL FUNCTION PANEL
Albumin: 1.3 g/dL — ABNORMAL LOW (ref 3.5–5.0)
Anion gap: 14 (ref 5–15)
BUN: 97 mg/dL — ABNORMAL HIGH (ref 6–20)
CO2: 14 mmol/L — ABNORMAL LOW (ref 22–32)
Calcium: 7.5 mg/dL — ABNORMAL LOW (ref 8.9–10.3)
Chloride: 103 mmol/L (ref 98–111)
Creatinine, Ser: 5.93 mg/dL — ABNORMAL HIGH (ref 0.61–1.24)
GFR, Estimated: 10 mL/min — ABNORMAL LOW (ref >60)
Glucose, Bld: 119 mg/dL — ABNORMAL HIGH (ref 70–99)
Phosphorus: 4 mg/dL (ref 2.5–4.6)
Potassium: 5.1 mmol/L (ref 3.5–5.1)
Sodium: 131 mmol/L — ABNORMAL LOW (ref 135–145)

## 2020-09-30 LAB — MAGNESIUM: Magnesium: 1.9 mg/dL (ref 1.7–2.4)

## 2020-09-30 NOTE — Progress Notes (Addendum)
   Patient Status: [facility]  Assessment and Plan: Patient in need of venous access.   Resp failure AKI Worsening renal function- Creatinine rising Scheduled for temporary dialysis catheter placement  ______________________________________________________________________   History of Present Illness: Maurice Ortega is a 60 y.o. male   Dr George Ina note 09/26/20: Pt is a 60 y.o. male with a PMHx of acute respiratory failure with hypoxia, aspiration pneumonia, ARDS, history of posterior circulation CVA, history of epidermoid cyst status postcraniotomy, chronic kidney disease stage II, diabetes mellitus type 2, dysphagia, who was admitted to [facility] on 09/11/2020 for ongoing management.  1.  Acute kidney injury/chronic kidney disease stage II baseline creatinine 0.9.  Suspect acute kidney injury due to medications with contribution from vancomycin and Lasix as well as dehydration.  Agree with IV fluid hydration. Renal ultrasound negative for hydronephrosis.  No immediate need for dialysis.  However this may need to be considered if renal function continues to deteriorate.  2.  Anemia of chronic kidney disease.  Hemoglobin 8.5.  No immediate need for Epogen but may need to consider this if hemoglobin continues to drop.  Request received for non tunneled dialysis catheter placement in IR Likely 5/9 in IR   Allergies and medications reviewed.   Review of Systems: A 12 point ROS discussed and pertinent positives are indicated in the HPI above.  All other systems are negative.   Vital Signs: There were no vitals taken for this visit.    Imaging reviewed.   Labs:  COAGS: Recent Labs    09/28/20 2246  INR 1.5*  APTT 39*    BMP: Recent Labs    09/26/20 0437 09/28/20 0504 09/28/20 2154 09/28/20 2246 09/30/20 0216  NA 135 132*  --  132* 131*  K 4.1 5.1  --  4.4 5.1  CL 104 106  --  109 103  CO2 20* 13*  --  12* 14*  GLUCOSE 131* 313* 63* 55* 119*  BUN 91*  89*  --  93* 97*  CALCIUM 7.7* 7.8*  --  7.4* 7.5*  CREATININE 4.57* 5.51*  --  5.93* 5.93*  GFRNONAA 14* 11*  --  10* 10*    Scheduled for non tunneled dialysis catheter placement AKI- rising creatinine: 5.93 Followed by Dr Cherylann Ratel Recommendation: Temp dialysis catheter if further deterioration- received request today in IR Planned for 5/9 in IR If would need emergently- consider bedside with Nephrology Pts brother Maurice Ortega is aware of procedure benefits and risks Agreeable to move ahead Consent signed and in IR   Electronically Signed: Robet Leu, PA-C 09/30/2020, 7:50 AM   I spent a total of 15 minutes in face to face in clinical consultation, greater than 50% of which was counseling/coordinating care for venous access.

## 2020-10-01 ENCOUNTER — Other Ambulatory Visit (HOSPITAL_COMMUNITY): Payer: 59

## 2020-10-01 HISTORY — PX: IR US GUIDE VASC ACCESS RIGHT: IMG2390

## 2020-10-01 HISTORY — PX: IR FLUORO GUIDE CV LINE RIGHT: IMG2283

## 2020-10-01 LAB — HEPATITIS B CORE ANTIBODY, TOTAL: Hep B Core Total Ab: NONREACTIVE

## 2020-10-01 LAB — RENAL FUNCTION PANEL
Albumin: 1.2 g/dL — ABNORMAL LOW (ref 3.5–5.0)
Anion gap: 14 (ref 5–15)
BUN: 110 mg/dL — ABNORMAL HIGH (ref 6–20)
CO2: 17 mmol/L — ABNORMAL LOW (ref 22–32)
Calcium: 7.7 mg/dL — ABNORMAL LOW (ref 8.9–10.3)
Chloride: 102 mmol/L (ref 98–111)
Creatinine, Ser: 6.69 mg/dL — ABNORMAL HIGH (ref 0.61–1.24)
GFR, Estimated: 9 mL/min — ABNORMAL LOW (ref >60)
Glucose, Bld: 88 mg/dL (ref 70–99)
Phosphorus: 4.6 mg/dL (ref 2.5–4.6)
Potassium: 4.6 mmol/L (ref 3.5–5.1)
Sodium: 133 mmol/L — ABNORMAL LOW (ref 135–145)

## 2020-10-01 LAB — CBC
HCT: 24.5 % — ABNORMAL LOW (ref 39.0–52.0)
Hemoglobin: 8.3 g/dL — ABNORMAL LOW (ref 13.0–17.0)
MCH: 30.1 pg (ref 26.0–34.0)
MCHC: 33.9 g/dL (ref 30.0–36.0)
MCV: 88.8 fL (ref 80.0–100.0)
Platelets: 198 10*3/uL (ref 150–400)
RBC: 2.76 MIL/uL — ABNORMAL LOW (ref 4.22–5.81)
RDW: 16 % — ABNORMAL HIGH (ref 11.5–15.5)
WBC: 18.5 10*3/uL — ABNORMAL HIGH (ref 4.0–10.5)
nRBC: 0 % (ref 0.0–0.2)

## 2020-10-01 LAB — MAGNESIUM: Magnesium: 2.1 mg/dL (ref 1.7–2.4)

## 2020-10-01 LAB — HEPATITIS B SURFACE ANTIGEN: Hepatitis B Surface Ag: NONREACTIVE

## 2020-10-01 LAB — LACTIC ACID, PLASMA: Lactic Acid, Venous: 1.2 mmol/L (ref 0.5–1.9)

## 2020-10-01 MED ORDER — LIDOCAINE HCL 1 % IJ SOLN
INTRAMUSCULAR | Status: DC | PRN
  Administered 2020-10-01: 5 mL

## 2020-10-01 MED ORDER — HEPARIN SODIUM (PORCINE) 1000 UNIT/ML IJ SOLN
INTRAMUSCULAR | Status: AC
  Filled 2020-10-01: qty 1

## 2020-10-01 MED ORDER — LIDOCAINE HCL 1 % IJ SOLN
INTRAMUSCULAR | Status: AC
  Filled 2020-10-01: qty 20

## 2020-10-01 NOTE — Progress Notes (Signed)
[facility]  ROUNDING NOTE   Subjective:  Patient seen and evaluated at bedside. Continues to have deteriorating renal function. BUN up to 110 with a creatinine of 6.6. Hemodialysis attempted but patient dropped systolic blood pressure into the 70s. In addition hemodialysis catheter oozing.  Objective:  Vital signs in last 24 hours:  Temperature 96.7 pulse 92 respiration 17 blood pressure 134/79  Physical Exam: General:  Critically ill-appearing  Head:  Normocephalic, atraumatic. Moist oral mucosal membranes  Eyes:  Anicteric  Neck:  Supple  Lungs:   Coarse breath sounds bilateral, slightly increased work of breathing  Heart:  S1S2 no rubs  Abdomen:   Soft, nontender, bowel sounds present  Extremities:  Trace peripheral edema.  Neurologic:  Awake, but not following commands  Skin:  No acute rash  Access:  Right IJ temporary dialysis catheter    Basic Metabolic Panel: Recent Labs  Lab 09/26/20 0437 09/28/20 0504 09/28/20 2154 09/28/20 2246 09/30/20 0216 10/01/20 0637  NA 135 132*  --  132* 131* 133*  K 4.1 5.1  --  4.4 5.1 4.6  CL 104 106  --  109 103 102  CO2 20* 13*  --  12* 14* 17*  GLUCOSE 131* 313* 63* 55* 119* 88  BUN 91* 89*  --  93* 97* 110*  CREATININE 4.57* 5.51*  --  5.93* 5.93* 6.69*  CALCIUM 7.7* 7.8*  --  7.4* 7.5* 7.7*  MG 2.0  --   --   --  1.9 2.1  PHOS  --  4.3  --   --  4.0 4.6    Liver Function Tests: Recent Labs  Lab 09/28/20 0504 09/28/20 2246 09/30/20 0216 10/01/20 0637  AST  --  13*  --   --   ALT  --  12  --   --   ALKPHOS  --  119  --   --   BILITOT  --  0.8  --   --   PROT  --  4.9*  --   --   ALBUMIN 1.5* 1.3* 1.3* 1.2*   No results for input(s): LIPASE, AMYLASE in the last 168 hours. Recent Labs  Lab 09/28/20 2246  AMMONIA 29    CBC: Recent Labs  Lab 09/26/20 0437 09/28/20 0504 09/28/20 2246 09/30/20 0216 10/01/20 0637  WBC 21.2* 21.2* 18.3* 16.2* 18.5*  HGB 8.5* 9.2* 8.2* 8.2* 8.3*  HCT 25.6*  28.7* 24.8* 24.2* 24.5*  MCV 92.1 94.7 91.5 89.6 88.8  PLT 374 381 298 255 198    Cardiac Enzymes: No results for input(s): CKTOTAL, CKMB, CKMBINDEX, TROPONINI in the last 168 hours.  BNP: Invalid input(s): POCBNP  CBG: No results for input(s): GLUCAP in the last 168 hours.  Microbiology: Results for orders placed or performed during the hospital encounter of 09/13/2020  Expectorated Sputum Assessment w Gram Stain, Rflx to Resp Cult     Status: None   Collection Time: 09/08/20  2:45 PM   Specimen: Expectorated Sputum  Result Value Ref Range Status   Specimen Description EXPECTORATED SPUTUM  Final   Special Requests NONE  Final   Sputum evaluation   Final    Sputum specimen not acceptable for testing.  Please recollect.   Oswaldo ConroyOTIFIED KRPULANDE RN  @ 2014 09/08/20 EB Performed at [facility], [address] [place], [place] [address]    Report Status 09/08/2020 FINAL  Final  Expectorated Sputum Assessment w Gram Stain, Rflx to Resp Cult  Status: None   Collection Time: 09/08/20 10:10 PM   Specimen: Sputum  Result Value Ref Range Status   Specimen Description SPUTUM  Final   Special Requests NONE  Final   Sputum evaluation   Final    THIS SPECIMEN IS ACCEPTABLE FOR SPUTUM CULTURE Performed at [facility], [address], [place], [place] [address]    Report Status 09/09/2020 FINAL  Final  Culture, Respiratory w Gram Stain     Status: None   Collection Time: 09/08/20 10:10 PM   Specimen: SPU  Result Value Ref Range Status   Specimen Description SPUTUM  Final   Special Requests NONE Reflexed from [id number]  Final   Gram Stain   Final    ABUNDANT WBC PRESENT,BOTH PMN AND MONONUCLEAR FEW GRAM NEGATIVE RODS Performed at [facility], [address], [place], [place] [address]    Culture ABUNDANT KLEBSIELLA PNEUMONIAE  Final   Report Status 09/11/2020 FINAL  Final   Organism ID, Bacteria KLEBSIELLA PNEUMONIAE  Final      Susceptibility   Klebsiella  pneumoniae - MIC*    AMPICILLIN >=32 RESISTANT Resistant     CEFAZOLIN <=4 SENSITIVE Sensitive     CEFEPIME <=0.12 SENSITIVE Sensitive     CEFTAZIDIME <=1 SENSITIVE Sensitive     CEFTRIAXONE <=0.25 SENSITIVE Sensitive     CIPROFLOXACIN <=0.25 SENSITIVE Sensitive     GENTAMICIN <=1 SENSITIVE Sensitive     IMIPENEM <=0.25 SENSITIVE Sensitive     TRIMETH/SULFA <=20 SENSITIVE Sensitive     AMPICILLIN/SULBACTAM 16 INTERMEDIATE Intermediate     PIP/TAZO 16 SENSITIVE Sensitive     * ABUNDANT KLEBSIELLA PNEUMONIAE  Urine Culture     Status: None   Collection Time: 09/10/20  3:30 PM   Specimen: Urine, Random  Result Value Ref Range Status   Specimen Description URINE, RANDOM  Final   Special Requests NONE  Final   Culture   Final    NO GROWTH Performed at [facility], [address], [place], [place] [address]    Report Status 09/11/2020 FINAL  Final  Culture, Urine     Status: Abnormal   Collection Time: 09/27/20  3:39 PM   Specimen: Urine, Random  Result Value Ref Range Status   Specimen Description URINE, RANDOM  Final   Special Requests   Final    NONE Performed at [facility], [address], [place], [place] [address]    Culture 60,000 COLONIES/mL YEAST (A)  Final   Report Status 09/29/2020 FINAL  Final  Culture, blood (routine x 2)     Status: None (Preliminary result)   Collection Time: 09/28/20 12:59 PM   Specimen: BLOOD  Result Value Ref Range Status   Specimen Description BLOOD RIGHT ANTECUBITAL  Final   Special Requests   Final    BOTTLES DRAWN AEROBIC AND ANAEROBIC Blood Culture adequate volume   Culture   Final    NO GROWTH 3 DAYS Performed at [facility], [address] [place], [place] [address]    Report Status PENDING  Incomplete  Culture, blood (routine x 2)     Status: None (Preliminary result)   Collection Time: 09/28/20 12:59 PM   Specimen: BLOOD LEFT HAND  Result Value Ref Range Status   Specimen Description BLOOD LEFT HAND  Final    Special Requests   Final    BOTTLES DRAWN AEROBIC AND ANAEROBIC Blood Culture adequate volume   Culture   Final  NO GROWTH 3 DAYS Performed at [facility], [address] [place], [place] [address]    Report Status PENDING  Incomplete    Coagulation Studies: Recent Labs    09/28/20 2246  LABPROT 18.3*  INR 1.5*    Urinalysis: No results for input(s): COLORURINE, LABSPEC, PHURINE, GLUCOSEU, HGBUR, BILIRUBINUR, KETONESUR, PROTEINUR, UROBILINOGEN, NITRITE, LEUKOCYTESUR in the last 72 hours.  Invalid input(s): APPERANCEUR    Imaging: IR Fluoro Guide CV Line Right  Result Date: 10/01/2020 INDICATION: Acute kidney injury EXAM: Temporary hemodialysis catheter placement MEDICATIONS: None ANESTHESIA/SEDATION: None FLUOROSCOPY TIME:  Fluoroscopy Time: 0 minutes 42 seconds (11 mGy). COMPLICATIONS: None immediate. PROCEDURE: Informed written consent was obtained from the patient's brother, Lorin Picket, after a thorough discussion of the procedural risks, benefits and alternatives. All questions were addressed. Maximal Sterile Barrier Technique was utilized including caps, mask, sterile gowns, sterile gloves, sterile drape, hand hygiene and skin antiseptic. A timeout was performed prior to the initiation of the procedure. Right neck prepped and draped in the usual sterile fashion. All elements of maximal sterile barrier were utilized including, cap, mask, sterile gown, sterile gloves, large sterile drape, hand scrubbing and 2% Chlorhexidine for skin cleaning. The right internal jugular vein was evaluated with ultrasound and shown to be patent. A permanent ultrasound image was obtained and placed in the patient's medical record. Using sterile gel and a sterile probe cover, the right internal jugular vein was entered with a 21 ga needle during real time ultrasound guidance. 0.018 inch guidewire placed and 21 ga needle exchanged for transitional dilator set. Utilizing fluoroscopy, 0.035 inch  guidewire advanced through the needle without difficulty. Serial dilation performed, and catheter inserted over the guidewire. The tip was positioned in the right atrium. All lumens of the catheter aspirated and flushed well. The dialysis lumens were locked with Heparin. The catheter was secured to the skin with suture. The insertion site was covered with a Biopatch and sterile dressing. IMPRESSION: 20 cm non tunneled right IJ hemodialysis catheter placed using fluoroscopic and ultrasound guidance. Electronically Signed   By: Acquanetta Belling M.D.   On: 10/01/2020 15:53   IR US Guide Vasc Access Right  Result Date: 10/01/2020 INDICATION: Acute kidney injury EXAM: Temporary hemodialysis catheter placement MEDICATIONS: None ANESTHESIA/SEDATION: None FLUOROSCOPY TIME:  Fluoroscopy Time: 0 minutes 42 seconds (11 mGy). COMPLICATIONS: None immediate. PROCEDURE: Informed written consent was obtained from the patient's brother, Lorin Picket, after a thorough discussion of the procedural risks, benefits and alternatives. All questions were addressed. Maximal Sterile Barrier Technique was utilized including caps, mask, sterile gowns, sterile gloves, sterile drape, hand hygiene and skin antiseptic. A timeout was performed prior to the initiation of the procedure. Right neck prepped and draped in the usual sterile fashion. All elements of maximal sterile barrier were utilized including, cap, mask, sterile gown, sterile gloves, large sterile drape, hand scrubbing and 2% Chlorhexidine for skin cleaning. The right internal jugular vein was evaluated with ultrasound and shown to be patent. A permanent ultrasound image was obtained and placed in the patient's medical record. Using sterile gel and a sterile probe cover, the right internal jugular vein was entered with a 21 ga needle during real time ultrasound guidance. 0.018 inch guidewire placed and 21 ga needle exchanged for transitional dilator set. Utilizing fluoroscopy, 0.035 inch  guidewire advanced through the needle without difficulty. Serial dilation performed, and catheter inserted over the guidewire. The tip was positioned in the right atrium. All lumens of the catheter aspirated and flushed well. The  dialysis lumens were locked with Heparin. The catheter was secured to the skin with suture. The insertion site was covered with a Biopatch and sterile dressing. IMPRESSION: 20 cm non tunneled right IJ hemodialysis catheter placed using fluoroscopic and ultrasound guidance. Electronically Signed   By: Acquanetta Belling M.D.   On: 10/01/2020 15:53     Medications:    . heparin sodium (porcine)      . lidocaine       lidocaine  Assessment/ Plan:  60 y.o. male with a PMHx of acute respiratory failure with hypoxia, aspiration pneumonia, ARDS, history of posterior circulation CVA, history of epidermoid cyst status postcraniotomy, chronic kidney disease stage II, diabetes mellitus type 2, dysphagia, who was admitted to [facility] on 2020-10-01 for ongoing management.  1.  Acute kidney injury/chronic kidney disease stage II baseline creatinine 0.9.  Suspect acute kidney injury due to medications with contribution from vancomycin and Lasix as well as dehydration.   -Patient continues to have deteriorating renal function.  Hemodialysis was attempted but systolic blood pressure dropped into the 70s.  Therefore further dialysis held.  Consider retrying on Wednesday.  If he does not tolerate treatment well on Wednesday may need to consider CRRT if family continues to desire aggressive care.  2.  Anemia of chronic kidney disease.  Hemoglobin currently 8.3.  No immediate need for blood transfusion but given oozing from dialysis catheter may need to consider this if hemoglobin drops.   LOS: 0 Maurice Ortega 5/9/20227:30 PM

## 2020-10-01 NOTE — Procedures (Signed)
Interventional Radiology Procedure Note  Procedure: Temporary right IJV HD catheter  Indication: Renal Failure  Findings: Temporary right IJV HD catheter ready for use.  Tip located in the right atrium.  Please refer to procedural dictation for full description.  Complications: None  EBL: < 10 mL  Acquanetta Belling, MD [phone]

## 2020-10-02 ENCOUNTER — Other Ambulatory Visit (HOSPITAL_COMMUNITY): Payer: 59

## 2020-10-02 HISTORY — PX: IR FLUORO GUIDE CV LINE RIGHT: IMG2283

## 2020-10-02 LAB — RENAL FUNCTION PANEL
Albumin: 1.1 g/dL — ABNORMAL LOW (ref 3.5–5.0)
Anion gap: 14 (ref 5–15)
BUN: 115 mg/dL — ABNORMAL HIGH (ref 6–20)
CO2: 19 mmol/L — ABNORMAL LOW (ref 22–32)
Calcium: 7.7 mg/dL — ABNORMAL LOW (ref 8.9–10.3)
Chloride: 100 mmol/L (ref 98–111)
Creatinine, Ser: 6.46 mg/dL — ABNORMAL HIGH (ref 0.61–1.24)
GFR, Estimated: 9 mL/min — ABNORMAL LOW (ref >60)
Glucose, Bld: 179 mg/dL — ABNORMAL HIGH (ref 70–99)
Phosphorus: 5.4 mg/dL — ABNORMAL HIGH (ref 2.5–4.6)
Potassium: 4.9 mmol/L (ref 3.5–5.1)
Sodium: 133 mmol/L — ABNORMAL LOW (ref 135–145)

## 2020-10-02 LAB — CBC
HCT: 23.4 % — ABNORMAL LOW (ref 39.0–52.0)
Hemoglobin: 7.9 g/dL — ABNORMAL LOW (ref 13.0–17.0)
MCH: 30 pg (ref 26.0–34.0)
MCHC: 33.8 g/dL (ref 30.0–36.0)
MCV: 89 fL (ref 80.0–100.0)
Platelets: 149 10*3/uL — ABNORMAL LOW (ref 150–400)
RBC: 2.63 MIL/uL — ABNORMAL LOW (ref 4.22–5.81)
RDW: 16.3 % — ABNORMAL HIGH (ref 11.5–15.5)
WBC: 18.7 10*3/uL — ABNORMAL HIGH (ref 4.0–10.5)
nRBC: 0 % (ref 0.0–0.2)

## 2020-10-02 LAB — MAGNESIUM: Magnesium: 1.9 mg/dL (ref 1.7–2.4)

## 2020-10-02 LAB — HEPATITIS B SURFACE ANTIBODY, QUANTITATIVE: Hep B S AB Quant (Post): >1000 m[IU]/mL (ref >9.9)

## 2020-10-02 MED ORDER — LIDOCAINE HCL 1 % IJ SOLN
INTRAMUSCULAR | Status: DC | PRN
  Administered 2020-10-02: 5 mL

## 2020-10-02 MED ORDER — LIDOCAINE-EPINEPHRINE 1 %-1:100000 IJ SOLN
INTRAMUSCULAR | Status: AC
  Filled 2020-10-02: qty 1

## 2020-10-02 MED ORDER — HEPARIN SODIUM (PORCINE) 1000 UNIT/ML IJ SOLN
INTRAMUSCULAR | Status: AC
  Filled 2020-10-02: qty 1

## 2020-10-02 NOTE — Progress Notes (Signed)
0630 - called MD Lateef for updated orders. BP taken by this nurse 108/63 92 - currently on bipap. Per night RN, pt has has no difficulty breathing but was placed on bipap for sleep.  Pressure dressing changed overnight, cath continues to ooze though not as severe as yesterday.  Per MD Cherylann Ratel, he would like pt to be seen by IR for oozing and poor arterial flow prior to attempting tx again.   0800 - contacted [facility] and advised of Dr. Cherylann Ratel request. MD okay with this and will request repeat consult. Tx plan TBD.

## 2020-10-02 NOTE — Progress Notes (Signed)
Dialysis tx ended per DR. Lateef after multiple complications. Cath with poor arterial flow and unable to run as indicated, lines reversed during entire attempted tx without difficulty.  Cath site continues to ooze in spite of pressure dressing and sandbag in place with low BFR of 150.   Hypotension with decrease in respiration and o2 sat immediately following start of tx to 70/40 average, 14RR and o2 89-90% while pump running requiring temporary increase in [place] oxygen.  MD Lateef contacted several times over 30 minutes with hemodynamic instability unresolved by intervention resulting in a full liter NS bolus.  Tx ended d/t pt inability to tolerate and MD to reevaluate.  Attending RN. Charge RN, and attending MD notified of pt condition, tx end, and plan moving forward.

## 2020-10-02 NOTE — Procedures (Signed)
Pre-procedure Diagnosis: ESRD; Malfunctioning HD catheter. Post-procedure Diagnosis: Same  Successful conversion of malfunctioning temporary HD catheter to a tunneled HD catheter with tips terminating within the superior aspect of the right atrium.    Complications: None Immediate EBL: Minimal   The catheter is ready for immediate use.   Katherina Right, MD Pager #: [phone]

## 2020-10-03 ENCOUNTER — Other Ambulatory Visit (HOSPITAL_COMMUNITY): Payer: 59

## 2020-10-03 LAB — RENAL FUNCTION PANEL
Albumin: 1 g/dL — ABNORMAL LOW (ref 3.5–5.0)
Anion gap: 15 (ref 5–15)
BUN: 133 mg/dL — ABNORMAL HIGH (ref 6–20)
CO2: 19 mmol/L — ABNORMAL LOW (ref 22–32)
Calcium: 7.5 mg/dL — ABNORMAL LOW (ref 8.9–10.3)
Chloride: 98 mmol/L (ref 98–111)
Creatinine, Ser: 6.67 mg/dL — ABNORMAL HIGH (ref 0.61–1.24)
GFR, Estimated: 9 mL/min — ABNORMAL LOW (ref >60)
Glucose, Bld: 293 mg/dL — ABNORMAL HIGH (ref 70–99)
Phosphorus: 5.1 mg/dL — ABNORMAL HIGH (ref 2.5–4.6)
Potassium: 4.8 mmol/L (ref 3.5–5.1)
Sodium: 132 mmol/L — ABNORMAL LOW (ref 135–145)

## 2020-10-03 LAB — CULTURE, BLOOD (ROUTINE X 2)
Culture: NO GROWTH
Culture: NO GROWTH
Special Requests: ADEQUATE
Special Requests: ADEQUATE

## 2020-10-03 LAB — CBC
HCT: 21.6 % — ABNORMAL LOW (ref 39.0–52.0)
Hemoglobin: 7.6 g/dL — ABNORMAL LOW (ref 13.0–17.0)
MCH: 30.5 pg (ref 26.0–34.0)
MCHC: 35.2 g/dL (ref 30.0–36.0)
MCV: 86.7 fL (ref 80.0–100.0)
Platelets: 104 10*3/uL — ABNORMAL LOW (ref 150–400)
RBC: 2.49 MIL/uL — ABNORMAL LOW (ref 4.22–5.81)
RDW: 16.2 % — ABNORMAL HIGH (ref 11.5–15.5)
WBC: 12.9 10*3/uL — ABNORMAL HIGH (ref 4.0–10.5)
nRBC: 0 % (ref 0.0–0.2)

## 2020-10-03 LAB — MAGNESIUM: Magnesium: 1.9 mg/dL (ref 1.7–2.4)

## 2020-10-03 NOTE — Progress Notes (Signed)
[facility]  ROUNDING NOTE   Subjective:  Right IJ temporary dialysis catheter was exchanged for PermCath today. Appears critically ill. Currently on BiPAP.  Objective:  Vital signs in last 24 hours:  Temperature 97.1 pulse 96 respirations 18 blood pressure 127/73  Physical Exam: General:  Critically ill-appearing  Head:  Normocephalic, atraumatic. Moist oral mucosal membranes  Eyes:  Anicteric  Neck:  Supple  Lungs:   Coarse breath sounds bilateral, on BiPAP  Heart:  S1S2 no rubs  Abdomen:   Soft, nontender, bowel sounds present  Extremities:  Trace peripheral edema.  Neurologic:  Awake, but not following commands  Skin:  No acute rash  Access:  Right IJ PermCath placed 10/03/2020    Basic Metabolic Panel: Recent Labs  Lab 09/28/20 0504 09/28/20 2154 09/28/20 2246 09/30/20 0216 10/01/20 0637 10/02/20 0446 10/03/20 0425  NA 132*  --  132* 131* 133* 133* 132*  K 5.1  --  4.4 5.1 4.6 4.9 4.8  CL 106  --  109 103 102 100 98  CO2 13*  --  12* 14* 17* 19* 19*  GLUCOSE 313*   < > 55* 119* 88 179* 293*  BUN 89*  --  93* 97* 110* 115* 133*  CREATININE 5.51*  --  5.93* 5.93* 6.69* 6.46* 6.67*  CALCIUM 7.8*  --  7.4* 7.5* 7.7* 7.7* 7.5*  MG  --   --   --  1.9 2.1 1.9 1.9  PHOS 4.3  --   --  4.0 4.6 5.4* 5.1*   < > = values in this interval not displayed.    Liver Function Tests: Recent Labs  Lab 09/28/20 2246 09/30/20 0216 10/01/20 8786 10/02/20 0446 10/03/20 0425  AST 13*  --   --   --   --   ALT 12  --   --   --   --   ALKPHOS 119  --   --   --   --   BILITOT 0.8  --   --   --   --   PROT 4.9*  --   --   --   --   ALBUMIN 1.3* 1.3* 1.2* 1.1* 1.0*   No results for input(s): LIPASE, AMYLASE in the last 168 hours. Recent Labs  Lab 09/28/20 2246  AMMONIA 29    CBC: Recent Labs  Lab 09/28/20 2246 09/30/20 0216 10/01/20 0637 10/02/20 0446 10/03/20 0425  WBC 18.3* 16.2* 18.5* 18.7* 12.9*  HGB 8.2* 8.2* 8.3* 7.9* 7.6*  HCT 24.8* 24.2*  24.5* 23.4* 21.6*  MCV 91.5 89.6 88.8 89.0 86.7  PLT 298 255 198 149* 104*    Cardiac Enzymes: No results for input(s): CKTOTAL, CKMB, CKMBINDEX, TROPONINI in the last 168 hours.  BNP: Invalid input(s): POCBNP  CBG: No results for input(s): GLUCAP in the last 168 hours.  Microbiology: Results for orders placed or performed during the hospital encounter of 09-14-20  Expectorated Sputum Assessment w Gram Stain, Rflx to Resp Cult     Status: None   Collection Time: 09/08/20  2:45 PM   Specimen: Expectorated Sputum  Result Value Ref Range Status   Specimen Description EXPECTORATED SPUTUM  Final   Special Requests NONE  Final   Sputum evaluation   Final    Sputum specimen not acceptable for testing.  Please recollect.   Oswaldo Conroy RN  @ 2014 09/08/20 EB Performed at [facility], [address] [place], [place] [address]    Report Status 09/08/2020  FINAL  Final  Expectorated Sputum Assessment w Gram Stain, Rflx to Resp Cult     Status: None   Collection Time: 09/08/20 10:10 PM   Specimen: Sputum  Result Value Ref Range Status   Specimen Description SPUTUM  Final   Special Requests NONE  Final   Sputum evaluation   Final    THIS SPECIMEN IS ACCEPTABLE FOR SPUTUM CULTURE Performed at [facility], [address] [place], [place] [address]    Report Status 09/09/2020 FINAL  Final  Culture, Respiratory w Gram Stain     Status: None   Collection Time: 09/08/20 10:10 PM   Specimen: SPU  Result Value Ref Range Status   Specimen Description SPUTUM  Final   Special Requests NONE Reflexed from [id number]  Final   Gram Stain   Final    ABUNDANT WBC PRESENT,BOTH PMN AND MONONUCLEAR FEW GRAM NEGATIVE RODS Performed at [facility], [address], [place], [place] [address]    Culture ABUNDANT KLEBSIELLA PNEUMONIAE  Final   Report Status 09/11/2020 FINAL  Final   Organism ID, Bacteria KLEBSIELLA PNEUMONIAE  Final      Susceptibility   Klebsiella pneumoniae -  MIC*    AMPICILLIN >=32 RESISTANT Resistant     CEFAZOLIN <=4 SENSITIVE Sensitive     CEFEPIME <=0.12 SENSITIVE Sensitive     CEFTAZIDIME <=1 SENSITIVE Sensitive     CEFTRIAXONE <=0.25 SENSITIVE Sensitive     CIPROFLOXACIN <=0.25 SENSITIVE Sensitive     GENTAMICIN <=1 SENSITIVE Sensitive     IMIPENEM <=0.25 SENSITIVE Sensitive     TRIMETH/SULFA <=20 SENSITIVE Sensitive     AMPICILLIN/SULBACTAM 16 INTERMEDIATE Intermediate     PIP/TAZO 16 SENSITIVE Sensitive     * ABUNDANT KLEBSIELLA PNEUMONIAE  Urine Culture     Status: None   Collection Time: 09/10/20  3:30 PM   Specimen: Urine, Random  Result Value Ref Range Status   Specimen Description URINE, RANDOM  Final   Special Requests NONE  Final   Culture   Final    NO GROWTH Performed at [facility], [address] [place], [place] [address]    Report Status 09/11/2020 FINAL  Final  Culture, Urine     Status: Abnormal   Collection Time: 09/27/20  3:39 PM   Specimen: Urine, Random  Result Value Ref Range Status   Specimen Description URINE, RANDOM  Final   Special Requests   Final    NONE Performed at [facility], [address] [place], [place] [address]    Culture 60,000 COLONIES/mL YEAST (A)  Final   Report Status 09/29/2020 FINAL  Final  Culture, blood (routine x 2)     Status: None   Collection Time: 09/28/20 12:59 PM   Specimen: BLOOD  Result Value Ref Range Status   Specimen Description BLOOD RIGHT ANTECUBITAL  Final   Special Requests   Final    BOTTLES DRAWN AEROBIC AND ANAEROBIC Blood Culture adequate volume   Culture   Final    NO GROWTH 5 DAYS Performed at [facility], [address], [place], [place] [address]    Report Status 10/03/2020 FINAL  Final  Culture, blood (routine x 2)     Status: None   Collection Time: 09/28/20 12:59 PM   Specimen: BLOOD LEFT HAND  Result Value Ref Range Status   Specimen Description BLOOD LEFT HAND  Final   Special Requests   Final    BOTTLES  DRAWN  AEROBIC AND ANAEROBIC Blood Culture adequate volume   Culture   Final    NO GROWTH 5 DAYS Performed at [facility], [address] [place], [place] [address]    Report Status 10/03/2020 FINAL  Final    Coagulation Studies: No results for input(s): LABPROT, INR in the last 72 hours.  Urinalysis: No results for input(s): COLORURINE, LABSPEC, PHURINE, GLUCOSEU, HGBUR, BILIRUBINUR, KETONESUR, PROTEINUR, UROBILINOGEN, NITRITE, LEUKOCYTESUR in the last 72 hours.  Invalid input(s): APPERANCEUR    Imaging: IR Fluoro Guide CV Line Right  Result Date: 10/03/2020 INDICATION: End-stage renal disease, currently with poor functioning temporary dialysis catheter. Please perform fluoroscopic guided evaluation and replacement/exchange as indicated. EXAM: 1. SUPERIOR VENA CAVAGRAM 2. FLUOROSCOPIC GUIDED CONVERSION OF NON TUNNELED TO TUNNELED HEMODIALYSIS CATHETER COMPARISON:  Image guided placement of non tunneled hemodialysis catheter-10/01/2020 CONTRAST:  15 cc Omnipaque 300 MEDICATIONS: None ANESTHESIA/SEDATION: None FLUOROSCOPY TIME:  42 seconds (15 mGy) COMPLICATIONS: None immediate. PROCEDURE: Informed written consent was obtained from the patient after a discussion of the risks, benefits, and alternatives to treatment. Questions regarding the procedure were encouraged and answered. The skin and external portion of the existing temporary hemodialysis catheter was prepped with chlorhexidine in a sterile fashion, and a sterile drape was applied covering the operative field. Maximum barrier sterile technique with sterile gowns and gloves were used for the procedure. A timeout was performed prior to the initiation of the procedure. Preprocedural spot fluoroscopic image was obtained demonstrating stable positioning of the right jugular approach temporary dialysis catheter with tip overlying the superior aspect of the right atrium. While the venous lumen of the temporary dialysis catheter was noted to  easily aspirate and flush, there was intermittent difficulty aspirating from the arterial lumen. As such, a contrast injection was performed however failed to definitively identify an anatomic reason for the intermittent catheter occlusion. As such it was felt it was potentially device related. A short Amplatz wire was cannulated within the existing temporary dialysis catheter and advanced to the level of the IVC. Under imaging fluoroscopic guidance, a new 20 cm temporary dialysis catheter was exchanged over the Amplatz wire, however again, the temporary dialysis catheter demonstrated intermittent or aspiration from the arterial lumen. As such, the new temporary dialysis catheter was retracted to the more superior aspect of the SVC and a repeat superior vena cavagram was performed however again was negative for definitive anatomic explanation for the intermittent catheter malfunction. At this time, the decision was made to proceed with conversion of a temporary to a tunneled dialysis catheters as the aspiration lumens are both located at the distal tip of the dialysis catheter. A 19 cm tip to cuff palindrome dialysis catheter was then tunneled from a site along the anterior chest to the venotomy site. Under intermittent fluoroscopic guidance, the temporary dialysis catheter was exchanged for a peel-away sheath. The tunneled dialysis catheter was inserted into the peel-away sheath with tips ultimately terminating within the superior aspect of the right atrium. Final catheter positioning was confirmed and documented with a spot fluoroscopic image. The catheter aspirates and flushes normally. The catheter was flushed with appropriate volume heparin dwells. The catheter exit site was secured with a 0-Prolene retention sutures. The venotomy site was apposed with Dermabond and Steri-Strips. Both lumens were heparinized. A dressing was applied. The patient tolerated the procedure well without immediate post procedural  complication. IMPRESSION: 1. Successful fluoroscopic guided conversion of a temporary to a permanent 19 cm tip to cuff tunneled hemodialysis catheter  with tips terminating within the superior aspect of the right atrium. The new tunneled dialysis catheter is ready for immediate use. 2. No definitive explanation for intermittent difficulty aspirating from the arterial lumen of the temporary dialysis catheter, potentially a device malfunction. Electronically Signed   By: Simonne Come M.D.   On: 10/03/2020 08:36   DG CHEST PORT 1 VIEW  Result Date: 10/03/2020 CLINICAL DATA:  Pneumonia EXAM: PORTABLE CHEST 1 VIEW COMPARISON:  09/25/2020 FINDINGS: Dialysis catheter with tips at the right atrium. Patchy bilateral pulmonary infiltrate without detected change. Normal heart size and mediastinal contours. No pneumothorax or significant effusion IMPRESSION: Unchanged multifocal infiltrate. Electronically Signed   By: Marnee Spring M.D.   On: 10/03/2020 06:03     Medications:     lidocaine, lidocaine  Assessment/ Plan:  60 y.o. male with a PMHx of acute respiratory failure with hypoxia, aspiration pneumonia, ARDS, history of posterior circulation CVA, history of epidermoid cyst status postcraniotomy, chronic kidney disease stage II, diabetes mellitus type 2, dysphagia, who was admitted to [facility] on 09/18/20 for ongoing management.  1.  Acute kidney injury/chronic kidney disease stage II baseline creatinine 0.9.  Suspect acute kidney injury due to medications with contribution from vancomycin and Lasix as well as dehydration.   -Patient underwent dialysis treatment today.  Tolerated much better than his prior treatment.  We will plan for hemodialysis treatment again on Friday.  2.  Anemia of chronic kidney disease.  Hemoglobin is dropped down to 7.6.  Continue to monitor hemoglobin closely as he had recent bleeding from his prior temporary dialysis catheter.  No further bleeding.  3.  Acute  respiratory failure.  Currently maintained on BiPAP.   LOS: 0 Sherie Dobrowolski 5/11/20226:58 PM

## 2020-10-04 LAB — CBC
HCT: 20.5 % — ABNORMAL LOW (ref 39.0–52.0)
HCT: 27.9 % — ABNORMAL LOW (ref 39.0–52.0)
Hemoglobin: 6.8 g/dL — CL (ref 13.0–17.0)
Hemoglobin: 9.4 g/dL — ABNORMAL LOW (ref 13.0–17.0)
MCH: 29.6 pg (ref 26.0–34.0)
MCH: 29.7 pg (ref 26.0–34.0)
MCHC: 33.2 g/dL (ref 30.0–36.0)
MCHC: 33.7 g/dL (ref 30.0–36.0)
MCV: 89.1 fL (ref 80.0–100.0)
MCV: 88 fL (ref 80.0–100.0)
Platelets: DECREASED 10*3/uL (ref 150–400)
Platelets: 60 10*3/uL — ABNORMAL LOW (ref 150–400)
RBC: 2.3 MIL/uL — ABNORMAL LOW (ref 4.22–5.81)
RBC: 3.17 MIL/uL — ABNORMAL LOW (ref 4.22–5.81)
RDW: 16.2 % — ABNORMAL HIGH (ref 11.5–15.5)
RDW: 15.4 % (ref 11.5–15.5)
WBC: 17.6 10*3/uL — ABNORMAL HIGH (ref 4.0–10.5)
WBC: 15.1 10*3/uL — ABNORMAL HIGH (ref 4.0–10.5)
nRBC: 0.1 % (ref 0.0–0.2)
nRBC: 0 % (ref 0.0–0.2)

## 2020-10-04 LAB — PREPARE RBC (CROSSMATCH)

## 2020-10-04 LAB — RENAL FUNCTION PANEL
Albumin: 1.5 g/dL — ABNORMAL LOW (ref 3.5–5.0)
Anion gap: 16 — ABNORMAL HIGH (ref 5–15)
BUN: 118 mg/dL — ABNORMAL HIGH (ref 6–20)
CO2: 21 mmol/L — ABNORMAL LOW (ref 22–32)
Calcium: 7.9 mg/dL — ABNORMAL LOW (ref 8.9–10.3)
Chloride: 100 mmol/L (ref 98–111)
Creatinine, Ser: 6.05 mg/dL — ABNORMAL HIGH (ref 0.61–1.24)
GFR, Estimated: 10 mL/min — ABNORMAL LOW (ref >60)
Glucose, Bld: 326 mg/dL — ABNORMAL HIGH (ref 70–99)
Phosphorus: 4.6 mg/dL (ref 2.5–4.6)
Potassium: 4.3 mmol/L (ref 3.5–5.1)
Sodium: 137 mmol/L (ref 135–145)

## 2020-10-04 LAB — EXPECTORATED SPUTUM ASSESSMENT W GRAM STAIN, RFLX TO RESP C

## 2020-10-04 LAB — MAGNESIUM: Magnesium: 1.8 mg/dL (ref 1.7–2.4)

## 2020-10-05 LAB — TYPE AND SCREEN
ABO/RH(D): O POS
Antibody Screen: POSITIVE
DAT, IgG: POSITIVE
Donor AG Type: NEGATIVE
Unit division: 0

## 2020-10-05 LAB — CBC
HCT: 26 % — ABNORMAL LOW (ref 39.0–52.0)
Hemoglobin: 9 g/dL — ABNORMAL LOW (ref 13.0–17.0)
MCH: 30.4 pg (ref 26.0–34.0)
MCHC: 34.6 g/dL (ref 30.0–36.0)
MCV: 87.8 fL (ref 80.0–100.0)
Platelets: 56 10*3/uL — ABNORMAL LOW (ref 150–400)
RBC: 2.96 MIL/uL — ABNORMAL LOW (ref 4.22–5.81)
RDW: 15.6 % — ABNORMAL HIGH (ref 11.5–15.5)
WBC: 18.7 10*3/uL — ABNORMAL HIGH (ref 4.0–10.5)
nRBC: 0 % (ref 0.0–0.2)

## 2020-10-05 LAB — RENAL FUNCTION PANEL
Albumin: 1.4 g/dL — ABNORMAL LOW (ref 3.5–5.0)
Anion gap: 16 — ABNORMAL HIGH (ref 5–15)
BUN: 143 mg/dL — ABNORMAL HIGH (ref 6–20)
CO2: 23 mmol/L (ref 22–32)
Calcium: 8.1 mg/dL — ABNORMAL LOW (ref 8.9–10.3)
Chloride: 97 mmol/L — ABNORMAL LOW (ref 98–111)
Creatinine, Ser: 6.21 mg/dL — ABNORMAL HIGH (ref 0.61–1.24)
GFR, Estimated: 10 mL/min — ABNORMAL LOW (ref >60)
Glucose, Bld: 238 mg/dL — ABNORMAL HIGH (ref 70–99)
Phosphorus: 4.5 mg/dL (ref 2.5–4.6)
Potassium: 4.2 mmol/L (ref 3.5–5.1)
Sodium: 136 mmol/L (ref 135–145)

## 2020-10-05 LAB — BPAM RBC
Blood Product Expiration Date: 202206022359
ISSUE DATE / TIME: 202205121330
Unit Type and Rh: 5100

## 2020-10-05 LAB — MAGNESIUM: Magnesium: 1.9 mg/dL (ref 1.7–2.4)

## 2020-10-05 NOTE — Progress Notes (Signed)
[facility]  ROUNDING NOTE   Subjective:  Patient remains critically ill. Still on BiPAP. Due for another dialysis session today.  Objective:  Vital signs in last 24 hours:  Temperature 96.9 pulse 99 respirations 19 blood pressure 144/78  Physical Exam: General:  Critically ill-appearing  Head:  Normocephalic, atraumatic. Moist oral mucosal membranes  Eyes:  Anicteric  Neck:  Supple  Lungs:   Coarse breath sounds bilateral, on BiPAP  Heart:  S1S2 no rubs  Abdomen:   Soft, nontender, bowel sounds present  Extremities:  Trace peripheral edema.  Neurologic:  Awake, but not following commands  Skin:  No acute rash  Access:  Right IJ PermCath placed 10/03/2020    Basic Metabolic Panel: Recent Labs  Lab 10/01/20 0637 10/02/20 0446 10/03/20 0425 10/04/20 0507 10/05/20 0358  NA 133* 133* 132* 137 136  K 4.6 4.9 4.8 4.3 4.2  CL 102 100 98 100 97*  CO2 17* 19* 19* 21* 23  GLUCOSE 88 179* 293* 326* 238*  BUN 110* 115* 133* 118* 143*  CREATININE 6.69* 6.46* 6.67* 6.05* 6.21*  CALCIUM 7.7* 7.7* 7.5* 7.9* 8.1*  MG 2.1 1.9 1.9 1.8 1.9  PHOS 4.6 5.4* 5.1* 4.6 4.5    Liver Function Tests: Recent Labs  Lab 09/28/20 2246 09/30/20 0216 10/01/20 0350 10/02/20 0446 10/03/20 0425 10/04/20 0507 10/05/20 0358  AST 13*  --   --   --   --   --   --   ALT 12  --   --   --   --   --   --   ALKPHOS 119  --   --   --   --   --   --   BILITOT 0.8  --   --   --   --   --   --   PROT 4.9*  --   --   --   --   --   --   ALBUMIN 1.3*   < > 1.2* 1.1* 1.0* 1.5* 1.4*   < > = values in this interval not displayed.   No results for input(s): LIPASE, AMYLASE in the last 168 hours. Recent Labs  Lab 09/28/20 2246  AMMONIA 29    CBC: Recent Labs  Lab 10/02/20 0446 10/03/20 0425 10/04/20 0507 10/04/20 2101 10/05/20 0358  WBC 18.7* 12.9* 17.6* 15.1* 18.7*  HGB 7.9* 7.6* 6.8* 9.4* 9.0*  HCT 23.4* 21.6* 20.5* 27.9* 26.0*  MCV 89.0 86.7 89.1 88.0 87.8  PLT 149* 104*  PLATELET CLUMPS NOTED ON SMEAR, COUNT APPEARS DECREASED 60* 56*    Cardiac Enzymes: No results for input(s): CKTOTAL, CKMB, CKMBINDEX, TROPONINI in the last 168 hours.  BNP: Invalid input(s): POCBNP  CBG: No results for input(s): GLUCAP in the last 168 hours.  Microbiology: Results for orders placed or performed during the hospital encounter of 09/19/2020  Expectorated Sputum Assessment w Gram Stain, Rflx to Resp Cult     Status: None   Collection Time: 09/08/20  2:45 PM   Specimen: Expectorated Sputum  Result Value Ref Range Status   Specimen Description EXPECTORATED SPUTUM  Final   Special Requests NONE  Final   Sputum evaluation   Final    Sputum specimen not acceptable for testing.  Please recollect.   Oswaldo Conroy RN  @ 2014 09/08/20 EB Performed at [facility], [address], [place], [place] [address]    Report Status 09/08/2020 FINAL  Final  Expectorated Sputum Assessment w  Gram Stain, Rflx to Resp Cult     Status: None   Collection Time: 09/08/20 10:10 PM   Specimen: Sputum  Result Value Ref Range Status   Specimen Description SPUTUM  Final   Special Requests NONE  Final   Sputum evaluation   Final    THIS SPECIMEN IS ACCEPTABLE FOR SPUTUM CULTURE Performed at [facility], [address], [place], [place] [address]    Report Status 09/09/2020 FINAL  Final  Culture, Respiratory w Gram Stain     Status: None   Collection Time: 09/08/20 10:10 PM   Specimen: SPU  Result Value Ref Range Status   Specimen Description SPUTUM  Final   Special Requests NONE Reflexed from [id number]  Final   Gram Stain   Final    ABUNDANT WBC PRESENT,BOTH PMN AND MONONUCLEAR FEW GRAM NEGATIVE RODS Performed at [facility], [address], [place], [place] [address]    Culture ABUNDANT KLEBSIELLA PNEUMONIAE  Final   Report Status 09/11/2020 FINAL  Final   Organism ID, Bacteria KLEBSIELLA PNEUMONIAE  Final      Susceptibility   Klebsiella pneumoniae - MIC*     AMPICILLIN >=32 RESISTANT Resistant     CEFAZOLIN <=4 SENSITIVE Sensitive     CEFEPIME <=0.12 SENSITIVE Sensitive     CEFTAZIDIME <=1 SENSITIVE Sensitive     CEFTRIAXONE <=0.25 SENSITIVE Sensitive     CIPROFLOXACIN <=0.25 SENSITIVE Sensitive     GENTAMICIN <=1 SENSITIVE Sensitive     IMIPENEM <=0.25 SENSITIVE Sensitive     TRIMETH/SULFA <=20 SENSITIVE Sensitive     AMPICILLIN/SULBACTAM 16 INTERMEDIATE Intermediate     PIP/TAZO 16 SENSITIVE Sensitive     * ABUNDANT KLEBSIELLA PNEUMONIAE  Urine Culture     Status: None   Collection Time: 09/10/20  3:30 PM   Specimen: Urine, Random  Result Value Ref Range Status   Specimen Description URINE, RANDOM  Final   Special Requests NONE  Final   Culture   Final    NO GROWTH Performed at [facility], [address] [place], [place] [address]    Report Status 09/11/2020 FINAL  Final  Culture, Urine     Status: Abnormal   Collection Time: 09/27/20  3:39 PM   Specimen: Urine, Random  Result Value Ref Range Status   Specimen Description URINE, RANDOM  Final   Special Requests   Final    NONE Performed at [facility], [address], [place], [place] [address]    Culture 60,000 COLONIES/mL YEAST (A)  Final   Report Status 09/29/2020 FINAL  Final  Culture, blood (routine x 2)     Status: None   Collection Time: 09/28/20 12:59 PM   Specimen: BLOOD  Result Value Ref Range Status   Specimen Description BLOOD RIGHT ANTECUBITAL  Final   Special Requests   Final    BOTTLES DRAWN AEROBIC AND ANAEROBIC Blood Culture adequate volume   Culture   Final    NO GROWTH 5 DAYS Performed at [facility], [address] [place], [place] [address]    Report Status 10/03/2020 FINAL  Final  Culture, blood (routine x 2)     Status: None   Collection Time: 09/28/20 12:59 PM   Specimen: BLOOD LEFT HAND  Result Value Ref Range Status   Specimen Description BLOOD LEFT HAND  Final   Special Requests   Final    BOTTLES DRAWN AEROBIC AND  ANAEROBIC Blood Culture adequate  volume   Culture   Final    NO GROWTH 5 DAYS Performed at [facility], [address] [place], [place] [address]    Report Status 10/03/2020 FINAL  Final  Expectorated Sputum Assessment w Gram Stain, Rflx to Resp Cult     Status: None   Collection Time: 10/04/20 10:51 AM   Specimen: Expectorated Sputum  Result Value Ref Range Status   Specimen Description EXPECTORATED SPUTUM  Final   Special Requests SPUTUM  Final   Sputum evaluation   Final    THIS SPECIMEN IS ACCEPTABLE FOR SPUTUM CULTURE Performed at [facility], [address], [place], [place] [address]    Report Status 10/04/2020 FINAL  Final  Culture, Respiratory w Gram Stain     Status: None (Preliminary result)   Collection Time: 10/04/20 10:51 AM  Result Value Ref Range Status   Specimen Description EXPECTORATED SPUTUM  Final   Special Requests SPUTUM Reflexed from [id number]  Final   Gram Stain   Final    ABUNDANT WBC PRESENT,BOTH PMN AND MONONUCLEAR FEW SQUAMOUS EPITHELIAL CELLS PRESENT FEW YEAST Performed at [facility], [address], [place], [place] [address]    Culture PENDING  Incomplete   Report Status PENDING  Incomplete    Coagulation Studies: No results for input(s): LABPROT, INR in the last 72 hours.  Urinalysis: No results for input(s): COLORURINE, LABSPEC, PHURINE, GLUCOSEU, HGBUR, BILIRUBINUR, KETONESUR, PROTEINUR, UROBILINOGEN, NITRITE, LEUKOCYTESUR in the last 72 hours.  Invalid input(s): APPERANCEUR    Imaging: No results found.   Medications:     lidocaine, lidocaine  Assessment/ Plan:  60 y.o. male with a PMHx of acute respiratory failure with hypoxia, aspiration pneumonia, ARDS, history of posterior circulation CVA, history of epidermoid cyst status postcraniotomy, chronic kidney disease stage II, diabetes mellitus type 2, dysphagia, who was admitted to [facility] on 09/19/2020 for ongoing management.  1.  Acute kidney  injury/chronic kidney disease stage II baseline creatinine 0.9.  Suspect acute kidney injury due to medications with contribution from vancomycin and Lasix as well as dehydration.   -We will plan for a longer dialysis session today for 2.5 hours with blood flow rate of 250 and dialysis flow rate of 500.  UF target 0.5 kg as tolerated today.  2.  Anemia of chronic kidney disease.  Hemoglobin up to 9.0 posttransfusion.  Continue to monitor.  3.  Acute respiratory failure.  Patient DNI status but currently on BiPAP.   LOS: 0 Maurice Ortega 5/13/20227:45 AM

## 2020-10-06 ENCOUNTER — Other Ambulatory Visit (HOSPITAL_COMMUNITY): Payer: 59

## 2020-10-06 LAB — MAGNESIUM: Magnesium: 1.7 mg/dL (ref 1.7–2.4)

## 2020-10-06 LAB — RENAL FUNCTION PANEL
Albumin: 1.7 g/dL — ABNORMAL LOW (ref 3.5–5.0)
Anion gap: 13 (ref 5–15)
BUN: 90 mg/dL — ABNORMAL HIGH (ref 6–20)
CO2: 25 mmol/L (ref 22–32)
Calcium: 8.3 mg/dL — ABNORMAL LOW (ref 8.9–10.3)
Chloride: 96 mmol/L — ABNORMAL LOW (ref 98–111)
Creatinine, Ser: 4.11 mg/dL — ABNORMAL HIGH (ref 0.61–1.24)
GFR, Estimated: 16 mL/min — ABNORMAL LOW (ref >60)
Glucose, Bld: 185 mg/dL — ABNORMAL HIGH (ref 70–99)
Phosphorus: 3.3 mg/dL (ref 2.5–4.6)
Potassium: 4.1 mmol/L (ref 3.5–5.1)
Sodium: 134 mmol/L — ABNORMAL LOW (ref 135–145)

## 2020-10-06 LAB — CBC
HCT: 23 % — ABNORMAL LOW (ref 39.0–52.0)
Hemoglobin: 8 g/dL — ABNORMAL LOW (ref 13.0–17.0)
MCH: 30.8 pg (ref 26.0–34.0)
MCHC: 34.8 g/dL (ref 30.0–36.0)
MCV: 88.5 fL (ref 80.0–100.0)
Platelets: 26 10*3/uL — CL (ref 150–400)
RBC: 2.6 MIL/uL — ABNORMAL LOW (ref 4.22–5.81)
RDW: 15.8 % — ABNORMAL HIGH (ref 11.5–15.5)
WBC: 22.1 10*3/uL — ABNORMAL HIGH (ref 4.0–10.5)
nRBC: 0 % (ref 0.0–0.2)

## 2020-10-08 LAB — CULTURE, RESPIRATORY W GRAM STAIN

## 2020-10-24 DEATH — deceased

## 2023-04-16 IMAGING — DX DG CHEST 1V PORT
1 series · 1 of 1 positions shown · non-contrast
Comparison: 10/03/2020

CLINICAL DATA: Unresponsive.  Shortness of breath.

EXAM:
PORTABLE CHEST 1 VIEW

[chest ap]
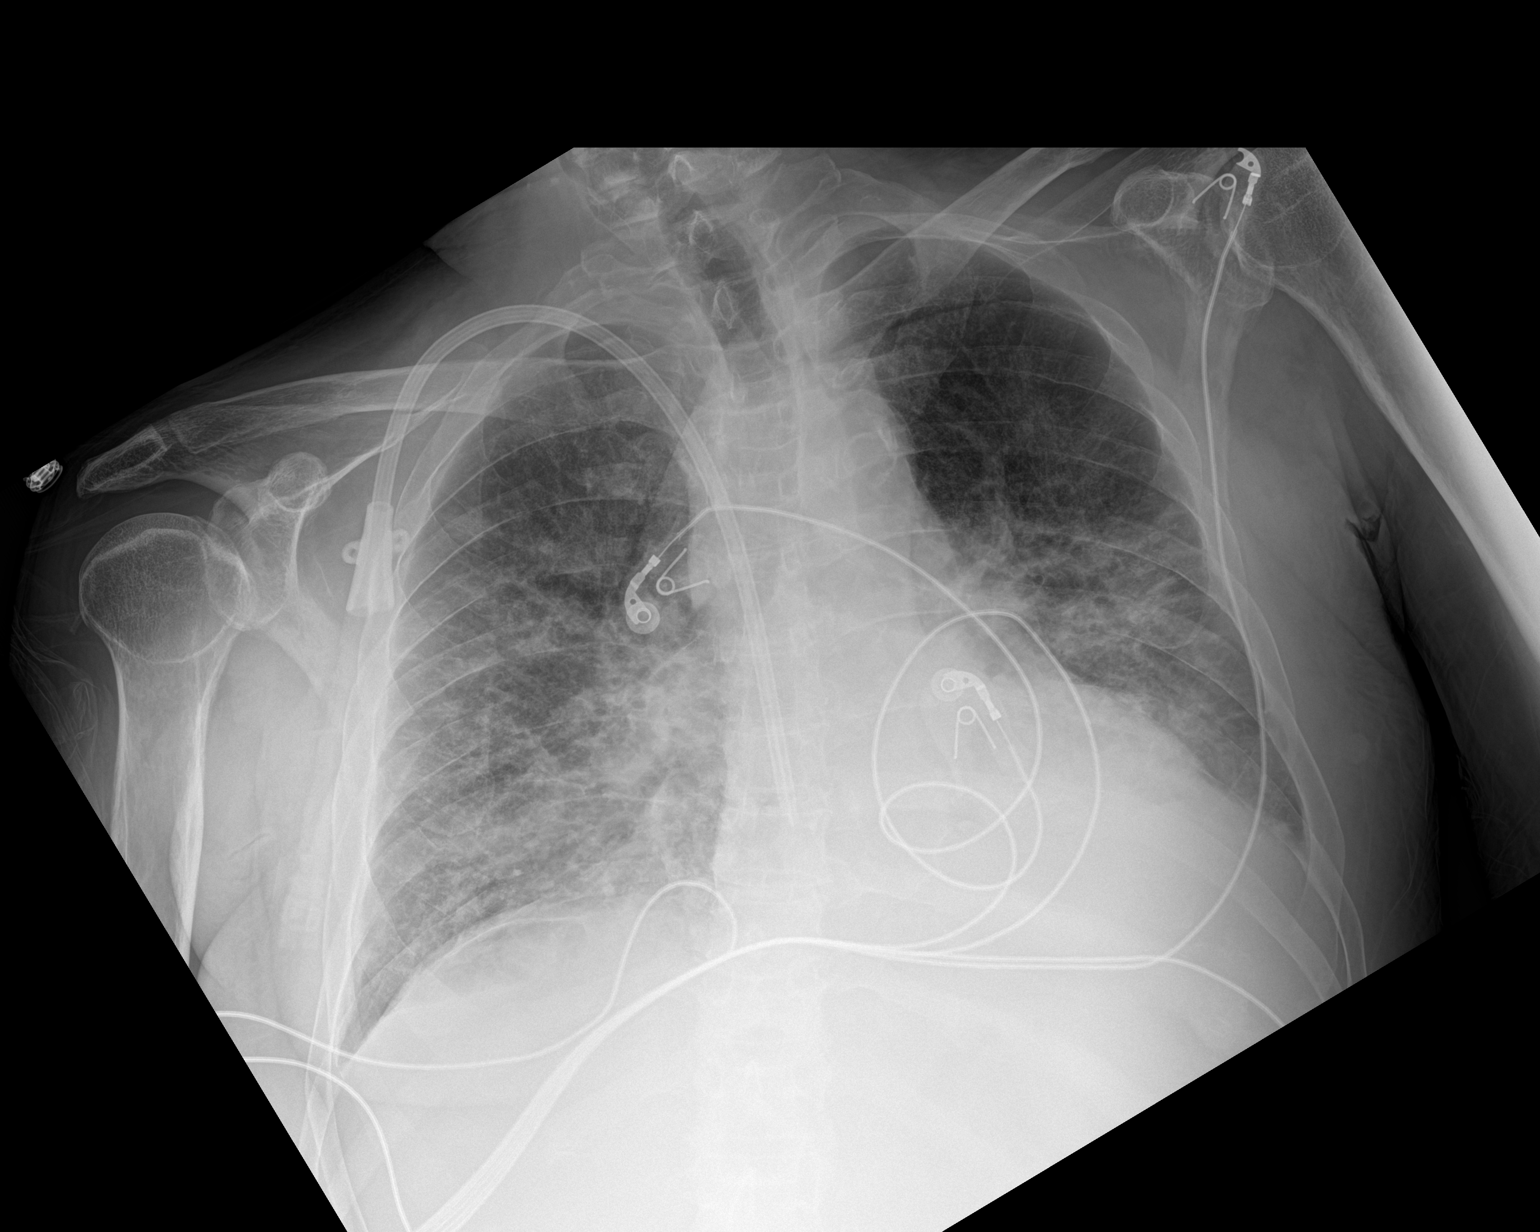

[1 of 1 positions shown; findings below may reference images not displayed]

FINDINGS: Patient has RIGHT-sided dialysis catheter, tip overlying the UPPER
RIGHT atrium. The heart is normal in size. Patchy parenchymal
infiltrates are identified throughout the lung bases, more confluent
at the LEFT lung base compared to prior study.
IMPRESSION: Increased bilateral pulmonary infiltrates.
